# Patient Record
Sex: Female | Born: 1940 | Race: White | Hispanic: No | Marital: Married | Smoking: Never smoker
Health system: Southern US, Community
[De-identification: ages and names within clinical notes are randomized; demographics above are authoritative.]

## PROBLEM LIST (undated history)

## (undated) DIAGNOSIS — M810 Age-related osteoporosis without current pathological fracture: Secondary | ICD-10-CM

## (undated) DIAGNOSIS — N183 Chronic kidney disease, stage 3 unspecified: Secondary | ICD-10-CM

## (undated) DIAGNOSIS — Z974 Presence of external hearing-aid: Secondary | ICD-10-CM

## (undated) DIAGNOSIS — C541 Malignant neoplasm of endometrium: Secondary | ICD-10-CM

## (undated) DIAGNOSIS — Z973 Presence of spectacles and contact lenses: Secondary | ICD-10-CM

## (undated) DIAGNOSIS — M1990 Unspecified osteoarthritis, unspecified site: Secondary | ICD-10-CM

## (undated) DIAGNOSIS — N840 Polyp of corpus uteri: Secondary | ICD-10-CM

## (undated) DIAGNOSIS — Z8669 Personal history of other diseases of the nervous system and sense organs: Secondary | ICD-10-CM

## (undated) HISTORY — PX: OTHER SURGICAL HISTORY: SHX169

## (undated) HISTORY — PX: TONSILLECTOMY: SUR1361

## (undated) HISTORY — PX: FEMUR FRACTURE SURGERY: SHX633

## (undated) HISTORY — PX: BREAST EXCISIONAL BIOPSY: SUR124

## (undated) HISTORY — DX: Chronic kidney disease, stage 3 unspecified: N18.30

## (undated) HISTORY — DX: Malignant neoplasm of endometrium: C54.1

## (undated) HISTORY — DX: Age-related osteoporosis without current pathological fracture: M81.0

## (undated) HISTORY — PX: CATARACT EXTRACTION: SUR2

---

## 1968-01-06 DIAGNOSIS — S060XAA Concussion with loss of consciousness status unknown, initial encounter: Secondary | ICD-10-CM

## 1968-01-06 DIAGNOSIS — S060X9A Concussion with loss of consciousness of unspecified duration, initial encounter: Secondary | ICD-10-CM

## 1968-01-06 HISTORY — DX: Concussion with loss of consciousness status unknown, initial encounter: S06.0XAA

## 1968-01-06 HISTORY — DX: Concussion with loss of consciousness of unspecified duration, initial encounter: S06.0X9A

## 1999-12-03 ENCOUNTER — Encounter: Payer: Self-pay | Admitting: General Surgery

## 1999-12-03 ENCOUNTER — Encounter: Admission: RE | Admit: 1999-12-03 | Discharge: 1999-12-03 | Payer: Self-pay | Admitting: General Surgery

## 2001-01-24 ENCOUNTER — Encounter: Admission: RE | Admit: 2001-01-24 | Discharge: 2001-01-24 | Payer: Self-pay | Admitting: General Surgery

## 2001-01-24 ENCOUNTER — Encounter: Payer: Self-pay | Admitting: General Surgery

## 2002-06-07 ENCOUNTER — Encounter: Admission: RE | Admit: 2002-06-07 | Discharge: 2002-06-07 | Payer: Self-pay | Admitting: General Surgery

## 2002-06-07 ENCOUNTER — Encounter: Payer: Self-pay | Admitting: General Surgery

## 2003-05-17 DIAGNOSIS — J309 Allergic rhinitis, unspecified: Secondary | ICD-10-CM | POA: Insufficient documentation

## 2003-08-10 ENCOUNTER — Encounter: Admission: RE | Admit: 2003-08-10 | Discharge: 2003-08-10 | Payer: Self-pay | Admitting: General Surgery

## 2004-08-13 ENCOUNTER — Encounter: Admission: RE | Admit: 2004-08-13 | Discharge: 2004-08-13 | Payer: Self-pay | Admitting: General Surgery

## 2005-08-17 ENCOUNTER — Encounter: Admission: RE | Admit: 2005-08-17 | Discharge: 2005-08-17 | Payer: Self-pay | Admitting: General Surgery

## 2006-10-26 ENCOUNTER — Encounter: Admission: RE | Admit: 2006-10-26 | Discharge: 2006-10-26 | Payer: Self-pay | Admitting: Obstetrics and Gynecology

## 2007-11-08 ENCOUNTER — Encounter: Admission: RE | Admit: 2007-11-08 | Discharge: 2007-11-08 | Payer: Self-pay | Admitting: Obstetrics and Gynecology

## 2009-09-27 ENCOUNTER — Encounter: Admission: RE | Admit: 2009-09-27 | Discharge: 2009-09-27 | Payer: Self-pay | Admitting: Obstetrics and Gynecology

## 2010-09-29 ENCOUNTER — Other Ambulatory Visit: Payer: Self-pay

## 2010-09-29 DIAGNOSIS — Z1231 Encounter for screening mammogram for malignant neoplasm of breast: Secondary | ICD-10-CM

## 2010-10-29 ENCOUNTER — Ambulatory Visit
Admission: RE | Admit: 2010-10-29 | Discharge: 2010-10-29 | Disposition: A | Discharge status: Home / Self Care | Payer: Medicare Other | Source: Ambulatory Visit

## 2010-10-29 DIAGNOSIS — Z1231 Encounter for screening mammogram for malignant neoplasm of breast: Secondary | ICD-10-CM

## 2010-11-12 DIAGNOSIS — M533 Sacrococcygeal disorders, not elsewhere classified: Secondary | ICD-10-CM | POA: Insufficient documentation

## 2011-03-24 ENCOUNTER — Encounter: Payer: Self-pay | Admitting: Gynecology

## 2011-03-24 ENCOUNTER — Ambulatory Visit (INDEPENDENT_AMBULATORY_CARE_PROVIDER_SITE_OTHER): Payer: Medicare Other | Admitting: Gynecology

## 2011-03-24 VITALS — BP 120/80 | Ht 63.0 in | Wt 145.0 lb

## 2011-03-24 DIAGNOSIS — M949 Disorder of cartilage, unspecified: Secondary | ICD-10-CM

## 2011-03-24 DIAGNOSIS — M899 Disorder of bone, unspecified: Secondary | ICD-10-CM

## 2011-03-24 DIAGNOSIS — Z79890 Hormone replacement therapy (postmenopausal): Secondary | ICD-10-CM

## 2011-03-24 DIAGNOSIS — N952 Postmenopausal atrophic vaginitis: Secondary | ICD-10-CM

## 2011-03-24 DIAGNOSIS — M8580 Other specified disorders of bone density and structure, unspecified site: Secondary | ICD-10-CM | POA: Insufficient documentation

## 2011-03-24 NOTE — Patient Instructions (Signed)
Follow up in one year for annual follow up.

## 2011-03-24 NOTE — Progress Notes (Signed)
Pam Smith [birth year] [id number]        71 y.o.  New patient, former patient of Dr. August Saucer [facility]. Has been using intermittent vaginal estrogen cream for atrophic genital changes.   Past medical history,surgical history, medications, allergies, family history and social history were all reviewed and documented in the EPIC chart. ROS:  Was performed and pertinent positives and negatives are included in the history.  Exam: Sherrilyn Rist chaperone present Filed Vitals:   03/24/11 0956  BP: 120/80   General appearance  Normal Skin grossly normal Head/Neck normal with no cervical or supraclavicular adenopathy thyroid normal Lungs  clear Cardiac RR, without RMG Abdominal  soft, nontender, without masses, organomegaly or hernia Breasts  examined lying and sitting without masses, retractions, discharge or axillary adenopathy. Pelvic  Ext/BUS/vagina  normal with atrophic genital changes  Cervix  normal    Uterus  anteverted, normal size, shape and contour, midline and mobile nontender   Adnexa  Without masses or tenderness    Anus and perineum  normal   Rectovaginal  normal sphincter tone without palpated masses or tenderness.    Assessment/Plan:  71 y.o. female for annual exam.    1. HRT. Patient had used Prempro in the past although has switched over to an intermittent vaginal Premarin cream for atrophic changes 3 times weekly. Patient admits to not using it consistently and wondered whether she needs to continue this. She's not having vaginitis symptoms and is not sexually active. She's had no bleeding or other symptoms. I discussed the WHI study with her in the issues of estrogen supplementation and the risks of stroke, heart attack, DVT, possible breast cancer issues. I reviewed the absorption issue with vaginal cream and alternatives to include Vagifem with less absorption. As she is asymptomatic and not sexually active I suggested she stop her estrogen cream altogether noting that she is  using it only occasionally now and we'll see how she does. The issue as to whether we should do any endometrial surveillance at this time such as ultrasound or endometrial biopsy was reviewed and given again that she has only used it sparingly and has no other symptoms such as bleeding we both agree on observation at present which I think is reasonable. 2. Pap smear. Patient has a history of ASCUS in 1997 with a negative colposcopy. All of her follow up Pap smears have been negative. I discussed current screening guidelines. As she is over the age of 62 and has no history of significant abnormal Pap smears options for stopping screening altogether or less frequent intervals reviewed at this point we will plan on stop screening. Her last Pap smear was February 2012 it was normal with atrophic changes. 3. Mammography. Patient is due for her mammogram now and knows to schedule this. SBE monthly reviewed. 4. Colonoscopy. Patient is within 5 years of her colonoscopy which she does receive every 5 years. I did give her an OC light kit to check her stool for blood and she knows to mail back, call us to make sure received it and that is negative. 5. Osteopenia. 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 She does not remember having a vitamin D level taken and she does not take extra vitamin D.  I ordered one today. She will otherwise follow up with her primary physician in reference to her bone health/medication use. 6. Health maintenance. No other blood work was done today  as it is all done through her primary physician's office. Assuming she continues well from a gynecologic standpoint and she will see me in a year, sooner as needed.   Dara Lords MD, 11:42 AM 03/24/2011

## 2011-03-25 LAB — VITAMIN D 25 HYDROXY (VIT D DEFICIENCY, FRACTURES): Vit D, 25-Hydroxy: 60 ng/mL (ref 30–89)

## 2011-04-28 DIAGNOSIS — Z1211 Encounter for screening for malignant neoplasm of colon: Secondary | ICD-10-CM

## 2011-04-29 ENCOUNTER — Other Ambulatory Visit: Payer: Self-pay | Admitting: *Deleted

## 2011-04-29 ENCOUNTER — Other Ambulatory Visit: Payer: Self-pay | Admitting: Gynecology

## 2011-04-29 DIAGNOSIS — Z1211 Encounter for screening for malignant neoplasm of colon: Secondary | ICD-10-CM

## 2011-10-20 ENCOUNTER — Other Ambulatory Visit: Payer: Self-pay | Admitting: Gynecology

## 2011-10-20 ENCOUNTER — Other Ambulatory Visit: Payer: Self-pay

## 2011-10-20 DIAGNOSIS — Z1231 Encounter for screening mammogram for malignant neoplasm of breast: Secondary | ICD-10-CM

## 2011-11-18 ENCOUNTER — Ambulatory Visit
Admission: RE | Admit: 2011-11-18 | Discharge: 2011-11-18 | Disposition: A | Discharge status: Home / Self Care | Payer: Medicare Other | Source: Ambulatory Visit | Attending: Gynecology | Admitting: Gynecology

## 2011-11-18 DIAGNOSIS — Z1231 Encounter for screening mammogram for malignant neoplasm of breast: Secondary | ICD-10-CM

## 2011-12-09 DIAGNOSIS — D171 Benign lipomatous neoplasm of skin and subcutaneous tissue of trunk: Secondary | ICD-10-CM | POA: Insufficient documentation

## 2012-03-29 ENCOUNTER — Encounter: Payer: Self-pay | Admitting: Gynecology

## 2012-03-29 ENCOUNTER — Ambulatory Visit (INDEPENDENT_AMBULATORY_CARE_PROVIDER_SITE_OTHER): Payer: Medicare Other | Admitting: Gynecology

## 2012-03-29 VITALS — BP 120/74 | Ht 62.0 in | Wt 146.0 lb

## 2012-03-29 DIAGNOSIS — N951 Menopausal and female climacteric states: Secondary | ICD-10-CM

## 2012-03-29 DIAGNOSIS — M8580 Other specified disorders of bone density and structure, unspecified site: Secondary | ICD-10-CM

## 2012-03-29 DIAGNOSIS — M899 Disorder of bone, unspecified: Secondary | ICD-10-CM

## 2012-03-29 DIAGNOSIS — N952 Postmenopausal atrophic vaginitis: Secondary | ICD-10-CM

## 2012-03-29 NOTE — Patient Instructions (Signed)
Follow up in one year for annual exam 

## 2012-03-29 NOTE — Progress Notes (Signed)
Pam Smith 05-19-1940 [id number]        72 y.o.  G1P1001 for followup exam.    Past medical history,surgical history, medications, allergies, family history and social history were all reviewed and documented in the EPIC chart. ROS:  Was performed and pertinent positives and negatives are included in the history.  Exam: [name] assistant Filed Vitals:   03/29/12 0943  BP: 120/74  Height: 5\' 2"  (1.575 m)  Weight: 146 lb (66.225 kg)   General appearance  Normal Skin grossly normal Head/Neck normal with no cervical or supraclavicular adenopathy thyroid normal Lungs  clear Cardiac RR, without RMG Abdominal  soft, nontender, without masses, organomegaly or hernia Breasts  examined lying and sitting without masses, retractions, discharge or axillary adenopathy. Pelvic  Ext/BUS/vagina  normal with atrophic changes  Cervix  normal with atrophic changes  Uterus  anteverted, normal size, shape and contour, midline and mobile nontender   Adnexa  Without masses or tenderness    Anus and perineum  normal   Rectovaginal  normal sphincter tone without palpated masses or tenderness.    Assessment/Plan:  72 y.o. G1P1001 female for followup exam.   1. Atrophic vaginitis.  Had been on Premarin vaginal cream last year and discontinued this. Has done well without symptoms and will continue to stay off of estrogen. 2. Menopausal symptoms. Patient having occasional hot flashes. I reviewed again with her the issues of HRT and whether it's worth risk versus benefits. Patient's not interested and would prefer just to monitor her symptoms. 3. Osteopenia. Being followed by her primary for low bone mass. On alendronate. Do not have copies of her DEXA. Patient will continue to follow up with her primary physician in reference to this. Increase calcium vitamin D reviewed. 4. Pap smear 2012. No Pap smear done today. History of ASCUS 1997 with negative colposcopy and normal Pap smears since then. Options to stop  screening altogether she is over the age of 70 versus less frequent screening intervals again reviewed with her and we'll readdress on an annual basis. 5. Colonoscopy 4 years ago. Due to repeat next year will followup for this. 6. Health maintenance. No lab work done as it is all done through her primary physician's office who she sees on record basis. Followup one year, sooner as needed.      Dara Lords MD, 10:26 AM 03/29/2012

## 2013-01-09 ENCOUNTER — Other Ambulatory Visit: Payer: Self-pay

## 2013-01-09 DIAGNOSIS — Z1231 Encounter for screening mammogram for malignant neoplasm of breast: Secondary | ICD-10-CM

## 2013-02-01 ENCOUNTER — Ambulatory Visit
Admission: RE | Admit: 2013-02-01 | Discharge: 2013-02-01 | Disposition: A | Discharge status: Home / Self Care | Payer: Medicare Other | Source: Ambulatory Visit

## 2013-02-01 DIAGNOSIS — Z1231 Encounter for screening mammogram for malignant neoplasm of breast: Secondary | ICD-10-CM

## 2013-03-31 ENCOUNTER — Encounter: Payer: Medicare Other | Admitting: Gynecology

## 2013-04-24 ENCOUNTER — Other Ambulatory Visit (HOSPITAL_COMMUNITY)
Admission: RE | Admit: 2013-04-24 | Discharge: 2013-04-24 | Disposition: A | Discharge status: Home / Self Care | Payer: Medicare Other | Source: Ambulatory Visit | Attending: Gynecology | Admitting: Gynecology

## 2013-04-24 ENCOUNTER — Encounter: Payer: Self-pay | Admitting: Gynecology

## 2013-04-24 ENCOUNTER — Ambulatory Visit (INDEPENDENT_AMBULATORY_CARE_PROVIDER_SITE_OTHER): Payer: Medicare Other | Admitting: Gynecology

## 2013-04-24 VITALS — BP 120/78 | Ht 62.0 in | Wt 152.0 lb

## 2013-04-24 DIAGNOSIS — M8580 Other specified disorders of bone density and structure, unspecified site: Secondary | ICD-10-CM

## 2013-04-24 DIAGNOSIS — Z124 Encounter for screening for malignant neoplasm of cervix: Secondary | ICD-10-CM | POA: Insufficient documentation

## 2013-04-24 DIAGNOSIS — M949 Disorder of cartilage, unspecified: Secondary | ICD-10-CM

## 2013-04-24 DIAGNOSIS — M899 Disorder of bone, unspecified: Secondary | ICD-10-CM

## 2013-04-24 DIAGNOSIS — N952 Postmenopausal atrophic vaginitis: Secondary | ICD-10-CM

## 2013-04-24 NOTE — Progress Notes (Signed)
Pam Smith 26-Mar-1940 [id number]        72 y.o.  G1P1001 for followup exam.  Several issues noted below.  Past medical history,surgical history, problem list, medications, allergies, family history and social history were all reviewed and documented as reviewed in the EPIC chart.  ROS:  12 system ROS performed with pertinent positives and negatives included in the history, assessment and plan.  Included Systems: General, HEENT, Neck, Cardiovascular, Pulmonary, Gastrointestinal, Genitourinary, Musculoskeletal, Dermatologic, Endocrine, Hematological, Neurologic, Psychiatric Additional significant findings : Urinary urgency felt secondary to ibuprofens now resolving since she stopped   Exam: Pam Smith assistant Filed Vitals:   04/24/13 1205  BP: 120/78  Height: 5\' 2"  (1.575 m)  Weight: 152 lb (68.947 kg)   General appearance:  Normal affect, orientation and appearance. Skin: Grossly normal HEENT: Normal without gross oral lesions, cervical or supraclavicular adenopathy. Thyroid normal.  Lungs:  Clear without wheezing, rales or rhonchi Cardiac: RR, without RMG Abdominal:  Soft, nontender, without masses, guarding, rebound, organomegaly or hernia Breasts:  Examined lying and sitting without masses, retractions, discharge or axillary adenopathy. Pelvic:  Ext/BUS/vagina with generalized atrophic changes  Cervix atrophic. Pap done  Uterus axial to anteverted, normal size, shape and contour, midline and mobile nontender   Adnexa  Without masses or tenderness    Anus and perineum  Normal   Rectovaginal  Normal sphincter tone without palpated masses or tenderness.    Assessment/Plan:  73 y.o. G60P1001 female for followup exam.  1. Atrophic vaginitis/postmenopausal. Using Premarin vaginal cream through her primary physician's order 3 times weekly. Doing well with this for vaginal dryness and wants to continue. He is providing the refills. We reviewed the risks of absorption and potential side  effects to include thrombosis such as stroke heart attack DVT, breast cancer, endometrial stimulation. Has been no vaginal bleeding. Patient wants to continue and will get the prescription through her primary. Patient knows to report any vaginal bleeding. 2. Pap smear 2012. Pap smear done today. History of ASCUS 1997 with negative colposcopy. Her followup Pap smears have all been negative. Reviewed current screening guidelines and osseous to stop screening she 81 age 81 discussed. Patient feels great uncomfortable with this and requested Pap smear today. 3. Osteopenia. Being followed by her primary for this on alendronate. We'll continue to follow up with him in reference to this. 4. Mammography 01/2013. Will continue with annual mammography. SBE monthly reviewed. 5. Colonoscopy 2010. Repeat at their recommended interval. 6. Health maintenance. No blood work done today as it's all done through her primary physician's office. Check urinalysis. Followup one year, sooner as needed.   Note: This document was prepared with digital dictation and possible smart phrase technology. Any transcriptional errors that result from this process are unintentional.   Pam Auerbach MD, 12:49 PM 04/24/2013

## 2013-04-24 NOTE — Patient Instructions (Addendum)
Followup in one year, sooner as needed.  Report any vaginal bleeding.

## 2013-04-25 LAB — URINALYSIS W MICROSCOPIC + REFLEX CULTURE
BILIRUBIN URINE: NEGATIVE
Bacteria, UA: NONE SEEN
CASTS: NONE SEEN
GLUCOSE, UA: NEGATIVE mg/dL
HGB URINE DIPSTICK: NEGATIVE
Ketones, ur: NEGATIVE mg/dL
LEUKOCYTES UA: NEGATIVE
Nitrite: NEGATIVE
PH: 5.5 (ref 5.0–8.0)
Protein, ur: NEGATIVE mg/dL
SQUAMOUS EPITHELIAL / LPF: NONE SEEN
Specific Gravity, Urine: 1.019 (ref 1.005–1.030)
Urobilinogen, UA: 0.2 mg/dL (ref 0.0–1.0)

## 2013-11-06 ENCOUNTER — Encounter: Payer: Self-pay | Admitting: Gynecology

## 2014-02-12 ENCOUNTER — Other Ambulatory Visit: Payer: Self-pay

## 2014-02-12 DIAGNOSIS — Z1231 Encounter for screening mammogram for malignant neoplasm of breast: Secondary | ICD-10-CM

## 2014-02-27 ENCOUNTER — Ambulatory Visit: Payer: Medicare Other

## 2014-03-02 ENCOUNTER — Ambulatory Visit
Admission: RE | Admit: 2014-03-02 | Discharge: 2014-03-02 | Disposition: A | Discharge status: Home / Self Care | Payer: Medicare Other | Source: Ambulatory Visit

## 2014-03-02 DIAGNOSIS — Z1231 Encounter for screening mammogram for malignant neoplasm of breast: Secondary | ICD-10-CM

## 2014-04-30 ENCOUNTER — Ambulatory Visit (INDEPENDENT_AMBULATORY_CARE_PROVIDER_SITE_OTHER): Payer: Medicare Other | Admitting: Gynecology

## 2014-04-30 ENCOUNTER — Encounter: Payer: Self-pay | Admitting: Gynecology

## 2014-04-30 VITALS — BP 126/78 | Ht 63.0 in | Wt 158.0 lb

## 2014-04-30 DIAGNOSIS — N952 Postmenopausal atrophic vaginitis: Secondary | ICD-10-CM

## 2014-04-30 DIAGNOSIS — Z01419 Encounter for gynecological examination (general) (routine) without abnormal findings: Secondary | ICD-10-CM

## 2014-04-30 DIAGNOSIS — M8580 Other specified disorders of bone density and structure, unspecified site: Secondary | ICD-10-CM

## 2014-04-30 NOTE — Progress Notes (Signed)
Pam Smith Dec 06, 1940 [id number]        74 y.o.  G1P1001 for breast and pelvic exam.  Past medical history,surgical history, problem list, medications, allergies, family history and social history were all reviewed and documented as reviewed in the EPIC chart.  ROS:  Performed with pertinent positives and negatives included in the history, assessment and plan.   Additional significant findings :  none   Exam: [name] Counsellor Vitals:   04/30/14 1448  BP: 126/78  Height: 5\' 3"  (1.6 m)  Weight: 158 lb (71.668 kg)   General appearance:  Normal affect, orientation and appearance. Skin: Grossly normal HEENT: Without gross lesions.  No cervical or supraclavicular adenopathy. Thyroid normal.  Lungs:  Clear without wheezing, rales or rhonchi Cardiac: RR, without RMG Abdominal:  Soft, nontender, without masses, guarding, rebound, organomegaly or hernia Breasts:  Examined lying and sitting without masses, retractions, discharge or axillary adenopathy. Pelvic:  Ext/BUS/vagina with generalized atrophic changes  Cervix with atrophic changes  Uterus anteverted, normal size, shape and contour, midline and mobile nontender   Adnexa  Without masses or tenderness    Anus and perineum  Normal   Rectovaginal  Normal sphincter tone without palpated masses or tenderness.    Assessment/Plan:  74 y.o. G21P1001 female for breast and pelvic exam.   1. Postmenopausal/atrophic genital changes. Patient continues to use Premarin vaginal cream 2 times to 3 times weekly per her primary physician. Feels that really helps her with her vaginal dryness. No vaginal bleeding. We have discussed the risks of absorption and side effects to include thrombosis, breast cancer and uterine stimulation. Patient's comfortable continuing. 2. Osteopenia. Followed by her primary physician. I have no copies of her bone densities. Reports her most recent density 01/2014 improved. Also reports being on Fosamax now for  approximately 5 years.  I reviewed with her the issues of drug-free holiday and to discuss this with her primary. She'll continue follow up with him in reference to this. 3. Pap smear 2015. No Pap smear done today. No history of significant abnormal Pap smears. Options to stop screening altogether she is over the age of 32 versus less frequent screening intervals reviewed. Will readdress on an annual basis. 4. Mammography 02/2014. Continue with annual mammography. SBE monthly reviewed. 5. Colonoscopy 2010. Repeat at their recommended interval. 6. Health maintenance. No routine lab work done as this is done at her primary physician's office. Follow up in one year, sooner as needed.     [name] MD, 3:20 PM 04/30/2014

## 2014-04-30 NOTE — Patient Instructions (Signed)
You may obtain a copy of any labs that were done today by logging onto MyChart as outlined in the instructions provided with your AVS (after visit summary). The office will not call with normal lab results but certainly if there are any significant abnormalities then we will contact you.   Health Maintenance, Female A healthy lifestyle and preventative care can promote health and wellness.  Maintain regular health, dental, and eye exams.  Eat a healthy diet. Foods like vegetables, fruits, whole grains, low-fat dairy products, and lean protein foods contain the nutrients you need without too many calories. Decrease your intake of foods high in solid fats, added sugars, and salt. Get information about a proper diet from your caregiver, if necessary.  Regular physical exercise is one of the most important things you can do for your health. Most adults should get at least 150 minutes of moderate-intensity exercise (any activity that increases your heart rate and causes you to sweat) each week. In addition, most adults need muscle-strengthening exercises on 2 or more days a week.   Maintain a healthy weight. The body mass index (BMI) is a screening tool to identify possible weight problems. It provides an estimate of body fat based on height and weight. Your caregiver can help determine your BMI, and can help you achieve or maintain a healthy weight. For adults 20 years and older:  A BMI below 18.5 is considered underweight.  A BMI of 18.5 to 24.9 is normal.  A BMI of 25 to 29.9 is considered overweight.  A BMI of 30 and above is considered obese.  Maintain normal blood lipids and cholesterol by exercising and minimizing your intake of saturated fat. Eat a balanced diet with plenty of fruits and vegetables. Blood tests for lipids and cholesterol should begin at age 61 and be repeated every 5 years. If your lipid or cholesterol levels are high, you are over 50, or you are a high risk for heart  disease, you may need your cholesterol levels checked more frequently.Ongoing high lipid and cholesterol levels should be treated with medicines if diet and exercise are not effective.  If you smoke, find out from your caregiver how to quit. If you do not use tobacco, do not start.  Lung cancer screening is recommended for adults aged 33 80 years who are at high risk for developing lung cancer because of a history of smoking. Yearly low-dose computed tomography (CT) is recommended for people who have at least a 30-pack-year history of smoking and are a current smoker or have quit within the past 15 years. A pack year of smoking is smoking an average of 1 pack of cigarettes a day for 1 year (for example: 1 pack a day for 30 years or 2 packs a day for 15 years). Yearly screening should continue until the smoker has stopped smoking for at least 15 years. Yearly screening should also be stopped for people who develop a health problem that would prevent them from having lung cancer treatment.  If you are pregnant, do not drink alcohol. If you are breastfeeding, be very cautious about drinking alcohol. If you are not pregnant and choose to drink alcohol, do not exceed 1 drink per day. One drink is considered to be 12 ounces (355 mL) of beer, 5 ounces (148 mL) of wine, or 1.5 ounces (44 mL) of liquor.  Avoid use of street drugs. Do not share needles with anyone. Ask for help if you need support or instructions about stopping  the use of drugs.  High blood pressure causes heart disease and increases the risk of stroke. Blood pressure should be checked at least every 1 to 2 years. Ongoing high blood pressure should be treated with medicines, if weight loss and exercise are not effective.  If you are 59 to 72 years old, ask your caregiver if you should take aspirin to prevent strokes.  Diabetes screening involves taking a blood sample to check your fasting blood sugar level. This should be done once every 3  years, after age 91, if you are within normal weight and without risk factors for diabetes. Testing should be considered at a younger age or be carried out more frequently if you are overweight and have at least 1 risk factor for diabetes.  Breast cancer screening is essential preventative care for women. You should practice "breast self-awareness." This means understanding the normal appearance and feel of your breasts and may include breast self-examination. Any changes detected, no matter how small, should be reported to a caregiver. Women in their 66s and 30s should have a clinical breast exam (CBE) by a caregiver as part of a regular health exam every 1 to 3 years. After age 101, women should have a CBE every year. Starting at age 100, women should consider having a mammogram (breast X-ray) every year. Women who have a family history of breast cancer should talk to their caregiver about genetic screening. Women at a high risk of breast cancer should talk to their caregiver about having an MRI and a mammogram every year.  Breast cancer gene (BRCA)-related cancer risk assessment is recommended for women who have family members with BRCA-related cancers. BRCA-related cancers include breast, ovarian, tubal, and peritoneal cancers. Having family members with these cancers may be associated with an increased risk for harmful changes (mutations) in the breast cancer genes BRCA1 and BRCA2. Results of the assessment will determine the need for genetic counseling and BRCA1 and BRCA2 testing.  The Pap test is a screening test for cervical cancer. Women should have a Pap test starting at age 57. Between ages 25 and 35, Pap tests should be repeated every 2 years. Beginning at age 37, you should have a Pap test every 3 years as long as the past 3 Pap tests have been normal. If you had a hysterectomy for a problem that was not cancer or a condition that could lead to cancer, then you no longer need Pap tests. If you are  between ages 50 and 76, and you have had normal Pap tests going back 10 years, you no longer need Pap tests. If you have had past treatment for cervical cancer or a condition that could lead to cancer, you need Pap tests and screening for cancer for at least 20 years after your treatment. If Pap tests have been discontinued, risk factors (such as a new sexual partner) need to be reassessed to determine if screening should be resumed. Some women have medical problems that increase the chance of getting cervical cancer. In these cases, your caregiver may recommend more frequent screening and Pap tests.  The human papillomavirus (HPV) test is an additional test that may be used for cervical cancer screening. The HPV test looks for the virus that can cause the cell changes on the cervix. The cells collected during the Pap test can be tested for HPV. The HPV test could be used to screen women aged 44 years and older, and should be used in women of any age  who have unclear Pap test results. After the age of 55, women should have HPV testing at the same frequency as a Pap test.  Colorectal cancer can be detected and often prevented. Most routine colorectal cancer screening begins at the age of 44 and continues through age 20. However, your caregiver may recommend screening at an earlier age if you have risk factors for colon cancer. On a yearly basis, your caregiver may provide home test kits to check for hidden blood in the stool. Use of a small camera at the end of a tube, to directly examine the colon (sigmoidoscopy or colonoscopy), can detect the earliest forms of colorectal cancer. Talk to your caregiver about this at age 86, when routine screening begins. Direct examination of the colon should be repeated every 5 to 10 years through age 13, unless early forms of pre-cancerous polyps or small growths are found.  Hepatitis C blood testing is recommended for all people born from [birth year] through 1963 and any  individual with known risks for hepatitis C.  Practice safe sex. Use condoms and avoid high-risk sexual practices to reduce the spread of sexually transmitted infections (STIs). Sexually active women aged 36 and younger should be checked for Chlamydia, which is a common sexually transmitted infection. Older women with new or multiple partners should also be tested for Chlamydia. Testing for other STIs is recommended if you are sexually active and at increased risk.  Osteoporosis is a disease in which the bones lose minerals and strength with aging. This can result in serious bone fractures. The risk of osteoporosis can be identified using a bone density scan. Women ages 20 and over and women at risk for fractures or osteoporosis should discuss screening with their caregivers. Ask your caregiver whether you should be taking a calcium supplement or vitamin D to reduce the rate of osteoporosis.  Menopause can be associated with physical symptoms and risks. Hormone replacement therapy is available to decrease symptoms and risks. You should talk to your caregiver about whether hormone replacement therapy is right for you.  Use sunscreen. Apply sunscreen liberally and repeatedly throughout the day. You should seek shade when your shadow is shorter than you. Protect yourself by wearing long sleeves, pants, a wide-brimmed hat, and sunglasses year round, whenever you are outdoors.  Notify your caregiver of new moles or changes in moles, especially if there is a change in shape or color. Also notify your caregiver if a mole is larger than the size of a pencil eraser.  Stay current with your immunizations. Document Released: 07/07/2010 Document Revised: 04/18/2012 Document Reviewed: 07/07/2010 [facility] Patient Information 2014 [employer].

## 2014-06-06 ENCOUNTER — Encounter: Payer: Medicare Other | Admitting: Gynecology

## 2014-10-18 DIAGNOSIS — E782 Mixed hyperlipidemia: Secondary | ICD-10-CM | POA: Insufficient documentation

## 2015-02-22 DIAGNOSIS — M810 Age-related osteoporosis without current pathological fracture: Secondary | ICD-10-CM | POA: Insufficient documentation

## 2015-02-22 DIAGNOSIS — K449 Diaphragmatic hernia without obstruction or gangrene: Secondary | ICD-10-CM | POA: Insufficient documentation

## 2015-03-08 ENCOUNTER — Other Ambulatory Visit: Payer: Self-pay

## 2015-03-08 DIAGNOSIS — Z1231 Encounter for screening mammogram for malignant neoplasm of breast: Secondary | ICD-10-CM

## 2015-03-27 ENCOUNTER — Ambulatory Visit
Admission: RE | Admit: 2015-03-27 | Discharge: 2015-03-27 | Disposition: A | Discharge status: Home / Self Care | Payer: Medicare Other | Source: Ambulatory Visit

## 2015-03-27 DIAGNOSIS — Z1231 Encounter for screening mammogram for malignant neoplasm of breast: Secondary | ICD-10-CM

## 2015-03-28 ENCOUNTER — Other Ambulatory Visit: Payer: Self-pay | Admitting: Gynecology

## 2015-03-28 DIAGNOSIS — R928 Other abnormal and inconclusive findings on diagnostic imaging of breast: Secondary | ICD-10-CM

## 2015-04-04 ENCOUNTER — Ambulatory Visit
Admission: RE | Admit: 2015-04-04 | Discharge: 2015-04-04 | Disposition: A | Discharge status: Home / Self Care | Payer: Medicare Other | Source: Ambulatory Visit | Attending: Gynecology | Admitting: Gynecology

## 2015-04-04 DIAGNOSIS — R928 Other abnormal and inconclusive findings on diagnostic imaging of breast: Secondary | ICD-10-CM

## 2015-05-06 ENCOUNTER — Encounter: Payer: Medicare Other | Admitting: Gynecology

## 2015-05-10 ENCOUNTER — Encounter: Payer: Self-pay | Admitting: Gynecology

## 2015-05-10 ENCOUNTER — Ambulatory Visit (INDEPENDENT_AMBULATORY_CARE_PROVIDER_SITE_OTHER): Payer: Medicare Other | Admitting: Gynecology

## 2015-05-10 VITALS — BP 124/74 | Ht 63.0 in | Wt 155.0 lb

## 2015-05-10 DIAGNOSIS — Z01419 Encounter for gynecological examination (general) (routine) without abnormal findings: Secondary | ICD-10-CM | POA: Diagnosis not present

## 2015-05-10 DIAGNOSIS — M8580 Other specified disorders of bone density and structure, unspecified site: Secondary | ICD-10-CM

## 2015-05-10 DIAGNOSIS — N952 Postmenopausal atrophic vaginitis: Secondary | ICD-10-CM

## 2015-05-10 MED ORDER — ESTROGENS, CONJUGATED 0.625 MG/GM VA CREA: 1.0000 g | TOPICAL_CREAM | VAGINAL | Status: DC

## 2015-05-10 NOTE — Patient Instructions (Signed)

## 2015-05-10 NOTE — Progress Notes (Signed)
    Pam Smith 08/18/1940 [id number]        75 y.o.  G1P1001  for breast and pelvic exam.  Past medical history,surgical history, problem list, medications, allergies, family history and social history were all reviewed and documented as reviewed in the EPIC chart.  ROS:  Performed with pertinent positives and negatives included in the history, assessment and plan.   Additional significant findings :  none   Exam: [name] assistant Filed Vitals:   05/10/15 1357  BP: 124/74  Height: 5\' 3"  (1.6 m)  Weight: 155 lb (70.308 kg)   General appearance:  Normal affect, orientation and appearance. Skin: Grossly normal HEENT: Without gross lesions.  No cervical or supraclavicular adenopathy. Thyroid normal.  Lungs:  Clear without wheezing, rales or rhonchi Cardiac: RR, without RMG Abdominal:  Soft, nontender, without masses, guarding, rebound, organomegaly or hernia Breasts:  Examined lying and sitting without masses, retractions, discharge or axillary adenopathy. Pelvic:  Ext/BUS/vagina with atrophic changes  Cervix flush with the upper vagina  Uterus anteverted, normal size, shape and contour, midline and mobile nontender   Adnexa without masses or tenderness    Anus and perineum normal   Rectovaginal normal sphincter tone without palpated masses or tenderness.    Assessment/Plan:  75 y.o. G86P1001 female for breast and pelvic exam.   1. Postmenopausal/atrophic genital changes. Patient using Premarin cream but not consistently. Notes an increase in UTIs that her primary physician is seeing her for. Reviewed options to include using the Premarin cream more consistently to see if this doesn't help with not only vaginal support but possible bladder support. I again reviewed risks of absorption and side effects to include thrombosis such as stroke, heart attack, DVT, possible breast cancer and endometrial stimulation. Patient's comfortable continuing and will use it twice weekly.  Refill 1 year provided. 2. Osteopenia. Actively being followed by her primary physician on Fosamax. They're discussing about when to start a drug-free holiday and she'll continue to follow up with him in reference to this. Reports being on this for approximately 4-5 years. 3. Pap smear 2015. No Pap smear done today. No history of significant abnormal Pap smears. Options to stop screening versus less frequent screening intervals reviewed. Will readdress on anal basis. 4. Mammography 03/2015. Continue with annual mammography when due. SBE monthly reviewed. 5. Colonoscopy 2010. Repeat at their recommended interval. 6. Health maintenance. No routine lab work done as this is done at her primary physician's office. Follow up 1 year, sooner as needed.   [name] MD, 2:27 PM 05/10/2015

## 2015-07-09 DIAGNOSIS — D126 Benign neoplasm of colon, unspecified: Secondary | ICD-10-CM | POA: Insufficient documentation

## 2016-04-06 ENCOUNTER — Other Ambulatory Visit: Payer: Self-pay | Admitting: Gynecology

## 2016-04-06 DIAGNOSIS — Z1231 Encounter for screening mammogram for malignant neoplasm of breast: Secondary | ICD-10-CM

## 2016-04-22 ENCOUNTER — Other Ambulatory Visit: Payer: Self-pay | Admitting: Gynecology

## 2016-04-22 ENCOUNTER — Ambulatory Visit
Admission: RE | Admit: 2016-04-22 | Discharge: 2016-04-22 | Disposition: A | Discharge status: Home / Self Care | Payer: Medicare Other | Source: Ambulatory Visit | Attending: Gynecology | Admitting: Gynecology

## 2016-04-22 DIAGNOSIS — Z1231 Encounter for screening mammogram for malignant neoplasm of breast: Secondary | ICD-10-CM

## 2016-04-24 ENCOUNTER — Other Ambulatory Visit: Payer: Self-pay | Admitting: Gynecology

## 2016-04-24 DIAGNOSIS — R928 Other abnormal and inconclusive findings on diagnostic imaging of breast: Secondary | ICD-10-CM

## 2016-04-29 ENCOUNTER — Ambulatory Visit
Admission: RE | Admit: 2016-04-29 | Discharge: 2016-04-29 | Disposition: A | Discharge status: Home / Self Care | Payer: Medicare Other | Source: Ambulatory Visit | Attending: Gynecology | Admitting: Gynecology

## 2016-04-29 DIAGNOSIS — R928 Other abnormal and inconclusive findings on diagnostic imaging of breast: Secondary | ICD-10-CM

## 2016-05-11 ENCOUNTER — Encounter: Payer: Medicare Other | Admitting: Gynecology

## 2016-05-18 ENCOUNTER — Encounter: Payer: Self-pay | Admitting: Gynecology

## 2016-05-18 ENCOUNTER — Ambulatory Visit (INDEPENDENT_AMBULATORY_CARE_PROVIDER_SITE_OTHER): Payer: Medicare Other | Admitting: Gynecology

## 2016-05-18 VITALS — BP 116/78 | Ht 63.0 in | Wt 161.0 lb

## 2016-05-18 DIAGNOSIS — Z01419 Encounter for gynecological examination (general) (routine) without abnormal findings: Secondary | ICD-10-CM

## 2016-05-18 DIAGNOSIS — M810 Age-related osteoporosis without current pathological fracture: Secondary | ICD-10-CM

## 2016-05-18 DIAGNOSIS — N952 Postmenopausal atrophic vaginitis: Secondary | ICD-10-CM

## 2016-05-18 DIAGNOSIS — Z01411 Encounter for gynecological examination (general) (routine) with abnormal findings: Secondary | ICD-10-CM

## 2016-05-18 MED ORDER — ESTROGENS, CONJUGATED 0.625 MG/GM VA CREA: 1.0000 g | TOPICAL_CREAM | VAGINAL | 5 refills | Status: DC

## 2016-05-18 NOTE — Patient Instructions (Signed)
Follow up in one year, sooner if any issues 

## 2016-05-18 NOTE — Progress Notes (Signed)
    Pam Smith [birth year] [id number]        76 y.o.  G1P1001 for breast and pelvic exam  Past medical history,surgical history, problem list, medications, allergies, family history and social history were all reviewed and documented as reviewed in the EPIC chart.  ROS:  Performed with pertinent positives and negatives included in the history, assessment and plan.   Additional significant findings :  None   Exam: [name] assistant Vitals:   05/18/16 1359  BP: 116/78  Weight: 161 lb (73 kg)  Height: 5\' 3"  (1.6 m)   Body mass index is 28.52 kg/m.  General appearance:  Normal affect, orientation and appearance. Skin: Grossly normal HEENT: Without gross lesions.  No cervical or supraclavicular adenopathy. Thyroid normal.  Lungs:  Clear without wheezing, rales or rhonchi Cardiac: RR, without RMG Abdominal:  Soft, nontender, without masses, guarding, rebound, organomegaly or hernia Breasts:  Examined lying and sitting without masses, retractions, discharge or axillary adenopathy. Pelvic:  Ext, BUS, Vagina: With atrophic changes  Cervix: With atrophic changes  Uterus: Anteverted, normal size, shape and contour, midline and mobile nontender   Adnexa: Without masses or tenderness    Anus and perineum: Normal   Rectovaginal: Normal sphincter tone without palpated masses or tenderness.    Assessment/Plan:  76 y.o. G87P1001 female for breast and pelvic exam.   1. Postmenopausal/atrophic genital changes. Continues to use Premarin intermittently for vaginal dryness. Also finds decreased UTI symptoms. We have discussed the risks of absorption with systemic effects to include thrombosis and breast cancer as well as endometrial stimulation. She is comfortable using it and I refilled her times a year. No vaginal bleeding and she knows importance reporting vaginal bleeding. 2. Osteopenia/osteoporosis.  Actively being followed by her primary for this with a recent DEXA. Continues on  Fosamax by her history for to 5 years. She'll continue to follow up with her primary physician in reference to this as I have no records of this. 3. Pap smear 2015. No Pap smear done today. No history of significant abnormal Pap smears. We discussed current screening guidelines and she is comfortable with stop screening. 4. Mammography 04/2016. Continue with annual mammography when due. SBE monthly reviewed. 5. Colonoscopy 2010. Repeat at their recommended interval. 6. Health maintenance. No routine lab work done as patient does this elsewhere. Follow up in one year, sooner as needed.   [name] MD, 2:37 PM 05/18/2016

## 2016-07-21 DIAGNOSIS — M1712 Unilateral primary osteoarthritis, left knee: Secondary | ICD-10-CM | POA: Insufficient documentation

## 2016-09-21 ENCOUNTER — Other Ambulatory Visit: Payer: Self-pay | Admitting: Gynecology

## 2016-09-21 DIAGNOSIS — N6320 Unspecified lump in the left breast, unspecified quadrant: Secondary | ICD-10-CM

## 2016-10-23 DIAGNOSIS — I83893 Varicose veins of bilateral lower extremities with other complications: Secondary | ICD-10-CM | POA: Insufficient documentation

## 2016-10-23 DIAGNOSIS — R600 Localized edema: Secondary | ICD-10-CM | POA: Insufficient documentation

## 2016-10-30 ENCOUNTER — Other Ambulatory Visit: Payer: Self-pay | Admitting: Gynecology

## 2016-10-30 ENCOUNTER — Ambulatory Visit
Admission: RE | Admit: 2016-10-30 | Discharge: 2016-10-30 | Disposition: A | Discharge status: Home / Self Care | Payer: Medicare Other | Source: Ambulatory Visit | Attending: Gynecology | Admitting: Gynecology

## 2016-10-30 DIAGNOSIS — R928 Other abnormal and inconclusive findings on diagnostic imaging of breast: Secondary | ICD-10-CM

## 2016-10-30 DIAGNOSIS — N6320 Unspecified lump in the left breast, unspecified quadrant: Secondary | ICD-10-CM

## 2017-03-31 DIAGNOSIS — F419 Anxiety disorder, unspecified: Secondary | ICD-10-CM | POA: Insufficient documentation

## 2017-05-03 ENCOUNTER — Ambulatory Visit
Admission: RE | Admit: 2017-05-03 | Discharge: 2017-05-03 | Disposition: A | Discharge status: Home / Self Care | Payer: Medicare Other | Source: Ambulatory Visit | Attending: Gynecology | Admitting: Gynecology

## 2017-05-03 DIAGNOSIS — R928 Other abnormal and inconclusive findings on diagnostic imaging of breast: Secondary | ICD-10-CM

## 2017-06-15 ENCOUNTER — Encounter: Payer: Self-pay | Admitting: Gynecology

## 2017-06-15 ENCOUNTER — Ambulatory Visit (INDEPENDENT_AMBULATORY_CARE_PROVIDER_SITE_OTHER): Payer: Medicare Other | Admitting: Gynecology

## 2017-06-15 VITALS — BP 118/76 | Ht 62.0 in | Wt 142.0 lb

## 2017-06-15 DIAGNOSIS — N952 Postmenopausal atrophic vaginitis: Secondary | ICD-10-CM

## 2017-06-15 DIAGNOSIS — Z01419 Encounter for gynecological examination (general) (routine) without abnormal findings: Secondary | ICD-10-CM

## 2017-06-15 DIAGNOSIS — M810 Age-related osteoporosis without current pathological fracture: Secondary | ICD-10-CM

## 2017-06-15 NOTE — Patient Instructions (Signed)
Follow-up in 1 to 2 years as you feel appropriate.

## 2017-06-15 NOTE — Progress Notes (Signed)
    Pam Smith February 13, 1940 [id number]        76 y.o.  G1P1001 for breast and pelvic exam.  Doing well without gynecologic complaints.  Past medical history,surgical history, problem list, medications, allergies, family history and social history were all reviewed and documented as reviewed in the EPIC chart.  ROS:  Performed with pertinent positives and negatives included in the history, assessment and plan.   Additional significant findings : None   Exam: Pam Smith assistant Vitals:   06/15/17 0920  BP: 118/76  Weight: 142 lb (64.4 kg)  Height: 5\' 2"  (1.575 m)   Body mass index is 25.97 kg/m.  General appearance:  Normal affect, orientation and appearance. Skin: Grossly normal HEENT: Without gross lesions.  No cervical or supraclavicular adenopathy. Thyroid normal.  Lungs:  Clear without wheezing, rales or rhonchi Cardiac: RR, without RMG Abdominal:  Soft, nontender, without masses, guarding, rebound, organomegaly or hernia Breasts:  Examined lying and sitting without masses, retractions, discharge or axillary adenopathy. Pelvic:  Ext, BUS, Vagina: With atrophic changes  Cervix: With atrophic changes  Uterus: Anteverted, normal size, shape and contour, midline and mobile nontender   Adnexa: Without masses or tenderness    Anus and perineum: Normal   Rectovaginal: Normal sphincter tone without palpated masses or tenderness.    Assessment/Plan:  77 y.o. G18P1001 female for breast and pelvic exam.   1. Postmenopausal/atrophic genital changes.  Continues with Premarin vaginal cream intermittently for vaginal dryness.  We have discussed the risks of absorption multiple times and she is comfortable continuing.  Has supply but will call when needs more. 2. Osteoporosis.  Reports DEXA 2018.  Continues on Fosamax through her primary physician's office.  We will continue to follow-up with them in reference to bone health management. 3. Mammography 04/2017.  Continue with annual  mammography and when due.  Breast exam normal today. 4. Pap smear 2015.  No Pap smear done today.  No history of significant abnormal Pap smears.  Per current screening guidelines we both agree to stop screening based on age. 5. Colonoscopy 2011.  Repeat at their recommended interval. 6. Health maintenance.  No routine lab work done as patient does this elsewhere.  Patient had questions as to how often she needs to see me.  We reviewed current recommendations from various groups as far as screening annual exam recommendations.  At this point the patient is going to decide whether she wants to continue to see me every year.  She knows to follow-up though regardless if she has any issues whatsoever.  [name] MD, 10:17 AM 06/15/2017

## 2017-11-01 ENCOUNTER — Telehealth: Payer: Self-pay | Admitting: *Deleted

## 2017-11-01 MED ORDER — ESTROGENS, CONJUGATED 0.625 MG/GM VA CREA: 1.0000 g | TOPICAL_CREAM | VAGINAL | 2 refills | Status: DC

## 2017-11-01 NOTE — Telephone Encounter (Signed)
Patient uses premarin vaginal cream at [facility] they no longer can get this cream, the pharmacist called around to several location pharmacy's in this area and they cannot get Rx as well. I called [facility] and they can order medication and have it ready for her tomorrow, I informed patient with this and [facility] would [facility]. Per note on 06/15/17 rx sent.

## 2018-03-21 ENCOUNTER — Other Ambulatory Visit: Payer: Self-pay | Admitting: Gynecology

## 2018-03-21 DIAGNOSIS — N6489 Other specified disorders of breast: Secondary | ICD-10-CM

## 2018-05-06 ENCOUNTER — Inpatient Hospital Stay: Admission: RE | Admit: 2018-05-06 | Payer: Medicare Other | Source: Ambulatory Visit

## 2018-07-13 ENCOUNTER — Ambulatory Visit
Admission: RE | Admit: 2018-07-13 | Discharge: 2018-07-13 | Disposition: A | Discharge status: Home / Self Care | Payer: Medicare Other | Source: Ambulatory Visit | Attending: Gynecology | Admitting: Gynecology

## 2018-07-13 DIAGNOSIS — N6489 Other specified disorders of breast: Secondary | ICD-10-CM

## 2018-10-13 ENCOUNTER — Encounter: Payer: Self-pay | Admitting: Gynecology

## 2019-04-24 ENCOUNTER — Other Ambulatory Visit: Payer: Self-pay

## 2019-04-25 ENCOUNTER — Ambulatory Visit (INDEPENDENT_AMBULATORY_CARE_PROVIDER_SITE_OTHER): Payer: Medicare Other | Admitting: Obstetrics and Gynecology

## 2019-04-25 ENCOUNTER — Encounter: Payer: Self-pay | Admitting: Obstetrics and Gynecology

## 2019-04-25 VITALS — BP 122/82 | Ht 62.0 in | Wt 146.0 lb

## 2019-04-25 DIAGNOSIS — M810 Age-related osteoporosis without current pathological fracture: Secondary | ICD-10-CM

## 2019-04-25 DIAGNOSIS — Z01419 Encounter for gynecological examination (general) (routine) without abnormal findings: Secondary | ICD-10-CM | POA: Diagnosis not present

## 2019-04-25 DIAGNOSIS — N952 Postmenopausal atrophic vaginitis: Secondary | ICD-10-CM

## 2019-04-25 MED ORDER — ESTROGENS, CONJUGATED 0.625 MG/GM VA CREA: 1.0000 g | TOPICAL_CREAM | VAGINAL | 2 refills | Status: DC

## 2019-04-25 NOTE — Progress Notes (Signed)
Pam Smith 05-03-1940 [id number]  SUBJECTIVE:  79 y.o. G1P1001 female for annual routine gynecologic exam. She has no gynecologic concerns.  Current Outpatient Medications  Medication Sig Dispense Refill  . alendronate (FOSAMAX) 70 MG tablet Take 70 mg by mouth every 7 (seven) days. Take with a full glass of water on an empty stomach.    . Biotin 5000 MCG CAPS Take by mouth.    . Calcium-Magnesium-Vitamin D (CITRACAL CALCIUM+D PO) Take by mouth. 400mg +500 twice a day    . Cholecalciferol (VITAMIN D) 2000 UNITS tablet Take 2,000 Units by mouth daily.    Marland Kitchen conjugated estrogens (PREMARIN) vaginal cream Place 0.5 Applicatorfuls vaginally 2 (two) times a week. 42.5 g 2  . CRANBERRY FRUIT PO Take by mouth. Plus vitamin c    . desmopressin (DDAVP) 0.2 MG tablet Take 0.2 mg by mouth daily.    . hydroquinone 4 % cream Apply topically 2 (two) times daily.    Marland Kitchen ibuprofen (ADVIL,MOTRIN) 600 MG tablet Take 600 mg by mouth every 6 (six) hours as needed for pain.    . rosuvastatin (CRESTOR) 20 MG tablet Take 20 mg by mouth daily.     No current facility-administered medications for this visit.   Allergies: Patient has no known allergies.  No LMP recorded. Patient is postmenopausal.  Past medical history,surgical history, problem list, medications, allergies, family history and social history were all reviewed and documented as reviewed in the EPIC chart.  ROS:  Feeling well. No dyspnea or chest pain on exertion.  No abdominal pain, change in bowel habits, black or bloody stools.  No urinary tract symptoms. GYN ROS: no abnormal bleeding, pelvic pain or discharge, no breast pain or new or enlarging lumps on self exam. No neurological complaints.   OBJECTIVE:  BP 122/82   Ht 5\' 2"  (1.575 m)   Wt 146 lb (66.2 kg)   BMI 26.70 kg/m  The patient appears well, alert, oriented x 3, in no distress. ENT normal.  Neck supple. No cervical or supraclavicular adenopathy or thyromegaly.  Lungs are  clear, good air entry, no wheezes, rhonchi or rales. S1 and S2 normal, no murmurs, regular rate and rhythm.  Abdomen soft without tenderness, guarding, mass or organomegaly.  Neurological is normal, no focal findings.  BREAST EXAM: breasts appear normal, no suspicious masses, no skin or nipple changes or axillary nodes  PELVIC EXAM: VULVA: normal appearing vulva with no masses, tenderness or lesions, atrophic changes noted, VAGINA: normal appearing vagina with normal color and discharge, no lesions, CERVIX: normal appearing atrophic cervix without discharge or lesions, UTERUS: uterus is small/normal size, shape, consistency and nontender, ADNEXA: normal adnexa in size, nontender and no masses  Chaperone: [name] present during the examination  ASSESSMENT:  79 y.o. G1P1001 here for annual gynecologic exam  PLAN:   1. Postmenopausal.  Remains on intermittent Premarin vaginal cream use for vaginal dryness, 2 nights per week, she understands risks of estrogen use with absorption possible through the vaginal cream and she would like to continue use.  Refill x1 year provided. 2. Pap smear 04/2013.  No significant history of abnormal Pap smears.  We discussed that she may choose to discontinue screening based on expert recommendations based on criteria of age and normal Pap smear history, and she agrees to stop screening at this point. 3. Mammogram 07/2018.  Normal breast exam today.  She is reminded to schedule an annual mammogram this year when due. 4. Colonoscopy 2011.  Recommended that she  follow up at the recommended interval.  She says she is coordinating with her PCP regarding getting this done soon and she feels comfortable to do it in the current pandemic. 5.  Osteoporosis.  Continues on Fosamax, managed by her primary care doctor.  DEXA reported in 2020 or so, we do not have records to review.  She indicates that overall things seemed stable in regards to BMD. 6. Health maintenance.  No labs  today as she normally has these completed elsewhere.  Return 1-2 years or sooner, prn.  Joseph Pierini MD 04/25/19

## 2019-08-23 ENCOUNTER — Other Ambulatory Visit: Payer: Self-pay | Admitting: Obstetrics and Gynecology

## 2019-08-23 DIAGNOSIS — Z1231 Encounter for screening mammogram for malignant neoplasm of breast: Secondary | ICD-10-CM

## 2019-08-25 ENCOUNTER — Telehealth: Payer: Self-pay | Admitting: *Deleted

## 2019-08-25 NOTE — Telephone Encounter (Signed)
Patient called requesting OTC recommendations to help with external itching. I told her could use OTC Monistat externally and internal if needed. If no improvement, will need to schedule an office visit.

## 2019-09-01 DIAGNOSIS — N1831 Chronic kidney disease, stage 3a: Secondary | ICD-10-CM | POA: Insufficient documentation

## 2019-09-06 ENCOUNTER — Ambulatory Visit
Admission: RE | Admit: 2019-09-06 | Discharge: 2019-09-06 | Disposition: A | Discharge status: Home / Self Care | Payer: Medicare Other | Source: Ambulatory Visit | Attending: Obstetrics and Gynecology | Admitting: Obstetrics and Gynecology

## 2019-09-06 ENCOUNTER — Other Ambulatory Visit: Payer: Self-pay

## 2019-09-06 DIAGNOSIS — Z1231 Encounter for screening mammogram for malignant neoplasm of breast: Secondary | ICD-10-CM

## 2019-10-20 DIAGNOSIS — K5730 Diverticulosis of large intestine without perforation or abscess without bleeding: Secondary | ICD-10-CM | POA: Insufficient documentation

## 2020-02-04 DIAGNOSIS — D508 Other iron deficiency anemias: Secondary | ICD-10-CM | POA: Insufficient documentation

## 2020-04-05 DIAGNOSIS — N950 Postmenopausal bleeding: Secondary | ICD-10-CM

## 2020-04-05 HISTORY — DX: Postmenopausal bleeding: N95.0

## 2020-04-16 DIAGNOSIS — M952 Other acquired deformity of head: Secondary | ICD-10-CM | POA: Insufficient documentation

## 2020-04-25 ENCOUNTER — Ambulatory Visit: Payer: Medicare Other | Admitting: Obstetrics & Gynecology

## 2020-04-29 ENCOUNTER — Other Ambulatory Visit: Payer: Self-pay

## 2020-04-29 ENCOUNTER — Other Ambulatory Visit (HOSPITAL_COMMUNITY)
Admission: RE | Admit: 2020-04-29 | Discharge: 2020-04-29 | Disposition: A | Discharge status: Home / Self Care | Payer: Medicare Other | Source: Ambulatory Visit | Attending: Obstetrics & Gynecology | Admitting: Obstetrics & Gynecology

## 2020-04-29 ENCOUNTER — Encounter: Payer: Self-pay | Admitting: Obstetrics & Gynecology

## 2020-04-29 ENCOUNTER — Ambulatory Visit (INDEPENDENT_AMBULATORY_CARE_PROVIDER_SITE_OTHER): Payer: Medicare Other | Admitting: Obstetrics & Gynecology

## 2020-04-29 VITALS — BP 134/86 | Ht 61.5 in | Wt 144.0 lb

## 2020-04-29 DIAGNOSIS — N950 Postmenopausal bleeding: Secondary | ICD-10-CM | POA: Diagnosis present

## 2020-04-29 DIAGNOSIS — N952 Postmenopausal atrophic vaginitis: Secondary | ICD-10-CM

## 2020-04-29 DIAGNOSIS — Z1151 Encounter for screening for human papillomavirus (HPV): Secondary | ICD-10-CM | POA: Insufficient documentation

## 2020-04-29 DIAGNOSIS — Z01419 Encounter for gynecological examination (general) (routine) without abnormal findings: Secondary | ICD-10-CM | POA: Insufficient documentation

## 2020-04-29 DIAGNOSIS — M810 Age-related osteoporosis without current pathological fracture: Secondary | ICD-10-CM | POA: Diagnosis not present

## 2020-04-29 NOTE — Progress Notes (Addendum)
Pam Smith [birth year] [id number]   History:    80 y.o. G1P1L1 Married   RP:  Established patient presenting for annual gyn exam   HPI: Postmenopausal, well on no systemic HRT.  Using Estrogen cream twice a week. Very light PMB x 1 in  03/2020.  The spotting was preceeded by pelvic cramping.  Currently having mild pelvic discomfort.  Scheduled for CT by urology given crystals/blood in urine. BMs normal.  Osteoporosis.  Was on Fosamax, but stopped because of the possibility of necrosis of the jaw, pending CT of jaw.  Breasts normal.  BMI 26.77.  Active.  Health labs with Fam MD.  Past medical history,surgical history, family history and social history were all reviewed and documented in the EPIC chart.  Gynecologic History No LMP recorded. Patient is postmenopausal.   Obstetric History OB History  Gravida Para Term Preterm AB Living  1 1 1     1   SAB IAB Ectopic Multiple Live Births               # Outcome Date GA Lbr Len/2nd Weight Sex Delivery Anes PTL Lv  1 Term              ROS: A ROS was performed and pertinent positives and negatives are included in the history.  GENERAL: No fevers or chills. HEENT: No change in vision, no earache, sore throat or sinus congestion. NECK: No pain or stiffness. CARDIOVASCULAR: No chest pain or pressure. No palpitations. PULMONARY: No shortness of breath, cough or wheeze. GASTROINTESTINAL: No abdominal pain, nausea, vomiting or diarrhea, melena or bright red blood per rectum. GENITOURINARY: No urinary frequency, urgency, hesitancy or dysuria. MUSCULOSKELETAL: No joint or muscle pain, no back pain, no recent trauma. DERMATOLOGIC: No rash, no itching, no lesions. ENDOCRINE: No polyuria, polydipsia, no heat or cold intolerance. No recent change in weight. HEMATOLOGICAL: No anemia or easy bruising or bleeding. NEUROLOGIC: No headache, seizures, numbness, tingling or weakness. PSYCHIATRIC: No depression, no loss of interest in normal activity or  change in sleep pattern.     Exam:   BP 134/86   Ht 5' 1.5" (1.562 m)   Wt 144 lb (65.3 kg)   BMI 26.77 kg/m   Body mass index is 26.77 kg/m.  General appearance : Well developed well nourished female. No acute distress HEENT: Eyes: no retinal hemorrhage or exudates,  Neck supple, trachea midline, no carotid bruits, no thyroidmegaly Lungs: Clear to auscultation, no rhonchi or wheezes, or rib retractions  Heart: Regular rate and rhythm, no murmurs or gallops Breast:Examined in sitting and supine position were symmetrical in appearance, no palpable masses or tenderness,  no skin retraction, no nipple inversion, no nipple discharge, no skin discoloration, no axillary or supraclavicular lymphadenopathy Abdomen: no palpable masses or tenderness, no rebound or guarding Extremities: no edema or skin discoloration or tenderness  Pelvic: Vulva: Normal             Vagina: No gross lesions or discharge  Cervix: No gross lesions or discharge. No blood. Pap reflex done.  Uterus  AV, normal size, shape and consistency, non-tender and mobile  Adnexa  Without masses or tenderness  Anus: Normal   Assessment/Plan:  80 y.o. female for annual exam   1. Encounter for routine gynecological examination with Papanicolaou smear of cervix Normal gynecologic exam in menopause.  Pap reflex done.  Breasts normal.  Screening mammo 09/2019 Negative.  BMI 26.77.  Health labs with [facility]. - Cytology -  PAP( [place])  2. Postmenopausal atrophic vaginitis Will wait on Pelvic US results before deciding on continuing or not with Estrogen cream.  3. Postmenopausal bleeding Very light vaginal bleeding x 1 with pelvic cramping/discomfort. Normal gyn exam today.  Pap test done.  Will further investigate with a Pelvic US/Possible EBx. - US Transvaginal Non-OB; Future - Cytology - PAP( [place])  4. Osteoporosis, unspecified osteoporosis type, unspecified pathological fracture presence BD 2021.  Followed by Heriberto Antigua MD.  Stopped Fosamax recently.  Will R/O Necrosis of the jaw with a CT.    Other orders - loratadine (CLARITIN) 10 MG tablet; Take 10 mg by mouth daily.  Princess Bruins MD, 3:24 PM 04/29/2020

## 2020-04-30 ENCOUNTER — Encounter: Payer: Self-pay | Admitting: Obstetrics & Gynecology

## 2020-04-30 NOTE — Addendum Note (Signed)
Addended by: Princess Bruins on: 04/30/2020 07:21 AM   Modules accepted: Level of Service

## 2020-05-06 LAB — CYTOLOGY - PAP
Comment: NEGATIVE
Diagnosis: UNDETERMINED — AB
High risk HPV: NEGATIVE

## 2020-05-07 DIAGNOSIS — M160 Bilateral primary osteoarthritis of hip: Secondary | ICD-10-CM | POA: Insufficient documentation

## 2020-05-07 DIAGNOSIS — M519 Unspecified thoracic, thoracolumbar and lumbosacral intervertebral disc disorder: Secondary | ICD-10-CM | POA: Insufficient documentation

## 2020-05-16 ENCOUNTER — Encounter: Payer: Self-pay | Admitting: Obstetrics & Gynecology

## 2020-05-16 ENCOUNTER — Ambulatory Visit (INDEPENDENT_AMBULATORY_CARE_PROVIDER_SITE_OTHER): Payer: Medicare Other

## 2020-05-16 ENCOUNTER — Ambulatory Visit (INDEPENDENT_AMBULATORY_CARE_PROVIDER_SITE_OTHER): Payer: Medicare Other | Admitting: Obstetrics & Gynecology

## 2020-05-16 ENCOUNTER — Other Ambulatory Visit: Payer: Self-pay

## 2020-05-16 VITALS — BP 132/76

## 2020-05-16 DIAGNOSIS — N950 Postmenopausal bleeding: Secondary | ICD-10-CM | POA: Diagnosis not present

## 2020-05-16 DIAGNOSIS — R9389 Abnormal findings on diagnostic imaging of other specified body structures: Secondary | ICD-10-CM

## 2020-05-16 DIAGNOSIS — N840 Polyp of corpus uteri: Secondary | ICD-10-CM

## 2020-05-16 NOTE — Progress Notes (Signed)
    Pam Smith [birth year] [id number]        80 y.o.  G1P1001   RP: PMB for Pelvic US  HPI: No PMB x last visit on 04/29/2020 when we noted: Postmenopausal, well on no systemic HRT.  Using Estrogen cream twice a week. Very light PMB x 1 in  03/2020.  The spotting was preceeded by pelvic cramping.  Currently having mild pelvic discomfort.  Scheduled for CT by urology given crystals/blood in urine. BMs normal.    OB History  Gravida Para Term Preterm AB Living  1 1 1     1   SAB IAB Ectopic Multiple Live Births               # Outcome Date GA Lbr Len/2nd Weight Sex Delivery Anes PTL Lv  1 Term             Past medical history,surgical history, problem list, medications, allergies, family history and social history were all reviewed and documented in the EPIC chart.   Directed ROS with pertinent positives and negatives documented in the history of present illness/assessment and plan.  Exam:  Vitals:   05/16/20 1456  BP: 132/76   General appearance:  Normal  Pelvic US today: T/V images.  Anteverted uterus atrophic with no myometrial mass.  The uterus is measured at 5.83 x 3.05 x 2.48 cm.  Echogenic and avascular endometrium with scant free fluid and echogenic mass measured at 8 x 6 mm with appearance of a feeder vessel, cannot rule out endometrial polyp.  Right ovary seen and atrophic.  Left ovary not seen, left adnexa is negative.  No adnexal mass.  No free fluid in the posterior cul-de-sac.   Assessment/Plan:  80 y.o. G1P1001   1. Postmenopausal bleeding Pelvic ultrasound findings thoroughly reviewed with patient.  Probable endometrial polyp.  Decision to proceed with hysteroscopy, excision of polyp with MyoSure and dilation and curettage.  Hysteroscopy pamphlet given.  We will schedule surgery and patient will follow-up for a preop visit.  2. Endometrial polyp As above.  Princess Bruins MD, 3:28 PM 05/16/2020

## 2020-05-18 ENCOUNTER — Encounter: Payer: Self-pay | Admitting: Obstetrics & Gynecology

## 2020-05-20 ENCOUNTER — Telehealth: Payer: Self-pay | Admitting: *Deleted

## 2020-05-20 NOTE — Telephone Encounter (Signed)
-----   Message from Princess Bruins, MD sent at 05/16/2020  3:29 PM EDT ----- Regarding: Schedule surgery Surgery:  Hysteroscopy, Myosure Excision of Endometrial Polyp, Dilation and Curettage  Diagnosis: Postmenopausal bleeding/Endometrial Polyp  Location: [facility]  Status: Outpatient  Time: 30 Minutes  Assistant: N/A  Urgency: First Available  Pre-Op Appointment: To Be Scheduled  Post-Op Appointment(s): 2 Weeks  Time Out Of Work: Day Of Surgery ONLY

## 2020-05-20 NOTE — Telephone Encounter (Signed)
Spoke with patient. Reviewed surgery dates. Patient request to proceed with surgery on 06/19/20.   Pre-op scheduled for 06/05/20 at 0830.   Advised patient I will return call to review further instructions once date and time confirmed. Patient agreeable.   Surgery request sent.  Routing to [employer]

## 2020-05-22 NOTE — Telephone Encounter (Signed)
Spoke with patient regarding surgery benefits. Patient acknowledges understanding of information presented. Patient is aware that benefits presented are professional benefits only. Patient is aware that once surgery is scheduled, the hospital will call with separate benefits. See account note.

## 2020-05-24 NOTE — Telephone Encounter (Signed)
Spoke with patient. Surgery date request confirmed.  Advised surgery is scheduled for 06/19/20 at [place], [facility] Surgery instruction sheet and hospital brochure reviewed, printed copy will be mailed. Pre-op OV 06/05/20.  Routing to provider. Encounter closed.   Cc: KimAlexis

## 2020-06-05 ENCOUNTER — Ambulatory Visit (INDEPENDENT_AMBULATORY_CARE_PROVIDER_SITE_OTHER): Payer: Medicare Other | Admitting: Obstetrics & Gynecology

## 2020-06-05 ENCOUNTER — Other Ambulatory Visit: Payer: Self-pay

## 2020-06-05 ENCOUNTER — Encounter: Payer: Self-pay | Admitting: Obstetrics & Gynecology

## 2020-06-05 ENCOUNTER — Ambulatory Visit: Payer: Self-pay | Admitting: Obstetrics & Gynecology

## 2020-06-05 VITALS — BP 132/80

## 2020-06-05 DIAGNOSIS — N950 Postmenopausal bleeding: Secondary | ICD-10-CM

## 2020-06-05 DIAGNOSIS — N840 Polyp of corpus uteri: Secondary | ICD-10-CM

## 2020-06-05 NOTE — Progress Notes (Signed)
    Pam Smith 03-Mar-1940 [id number]        80 y.o.  G1P1001   RP: Preop HSC/D+C/Myosure Excision  HPI: Postmenopause on no HRT.  PMB 03/2020 and last week with mild spotting and cramps.  Pelvic US 05/16/2020 showed a probable IU Polyp.   OB History  Gravida Para Term Preterm AB Living  1 1 1     1   SAB IAB Ectopic Multiple Live Births               # Outcome Date GA Lbr Len/2nd Weight Sex Delivery Anes PTL Lv  1 Term             Past medical history,surgical history, problem list, medications, allergies, family history and social history were all reviewed and documented in the EPIC chart.   Directed ROS with pertinent positives and negatives documented in the history of present illness/assessment and plan.  Exam:  Vitals:   06/05/20 0815  BP: 132/80   General appearance:  Normal  Pelvic US on 05/16/2020: T/V images.  Anteverted uterus atrophic with no myometrial mass.  The uterus is measured at 5.83 x 3.05 x 2.48 cm.  Echogenic and avascular endometrium with scant free fluid and echogenic mass measured at 8 x 6 mm with appearance of a feeder vessel, cannot rule out endometrial polyp.  Right ovary seen and atrophic.  Left ovary not seen, left adnexa is negative.  No adnexal mass.  No free fluid in the posterior cul-de-sac.   Assessment/Plan:  80 y.o. G1P1001   1. Postmenopausal bleeding Pelvic ultrasound findings reviewed with patient.  Probable endometrial polyp.  Decision to proceed with hysteroscopy, excision of polyp with MyoSure and dilation and curettage.  Hysteroscopy pamphlet given.  Preop preparation, surgical procedure with risks and postop precautions thoroughly reviewed with patient.  2. Endometrial polyp As above.                        Patient was counseled as to the risk of surgery to include the following:  1. Infection (prohylactic antibiotics will be administered)  2. DVT/Pulmonary Embolism (prophylactic pneumo compression stockings will be  used)  3.Trauma to internal organs requiring additional surgical procedure to repair any injury to internal organs requiring perhaps additional hospitalization days.  4.Hemmorhage requiring transfusion and blood products which carry risks such as anaphylactic reaction, hepatitis and AIDS  Patient had received literature information on the procedure scheduled and all her questions were answered and fully accepts all risk.   Princess Bruins MD, 8:39 AM 06/05/2020

## 2020-06-14 ENCOUNTER — Other Ambulatory Visit: Payer: Self-pay

## 2020-06-14 ENCOUNTER — Encounter (HOSPITAL_BASED_OUTPATIENT_CLINIC_OR_DEPARTMENT_OTHER): Payer: Self-pay | Admitting: Obstetrics & Gynecology

## 2020-06-14 NOTE — Progress Notes (Signed)
Spoke w/ via phone for pre-op interview--- Pt Lab needs dos----  CBC             Lab results------ no COVID test -----patient states asymptomatic no test needed Arrive at ------- 0630 on 06-19-2020 NPO after MN NO Solid Food.  Clear liquids from MN until---  0530 Med rec completed Medications to take morning of surgery ----- NONE Diabetic medication ----- n/a Patient instructed no nail polish to be worn day of surgery Patient instructed to bring photo id and insurance card day of surgery Patient aware to have Driver (ride ) / caregiver for 24 hours after surgery -- husband, Pam Smith Patient Special Instructions ----- n/a Pre-Op special Istructions -----n/a Patient verbalized understanding of instructions that were given at this phone interview. Patient denies shortness of breath, chest pain, fever, cough at this phone interview.

## 2020-06-18 NOTE — Anesthesia Preprocedure Evaluation (Addendum)
Anesthesia Evaluation  Patient identified by MRN, date of birth, ID band Patient awake    Reviewed: Allergy & Precautions, H&P , NPO status , Patient's Chart, lab work & pertinent test results  Airway Mallampati: III  TM Distance: >3 FB Neck ROM: Full    Dental no notable dental hx. (+) Teeth Intact, Dental Advisory Given   Pulmonary neg pulmonary ROS,    Pulmonary exam normal breath sounds clear to auscultation       Cardiovascular Exercise Tolerance: Good negative cardio ROS   Rhythm:Regular Rate:Normal     Neuro/Psych negative neurological ROS  negative psych ROS   GI/Hepatic negative GI ROS, Neg liver ROS,   Endo/Other  negative endocrine ROS  Renal/GU negative Renal ROS  negative genitourinary   Musculoskeletal  (+) Arthritis , Osteoarthritis,    Abdominal   Peds  Hematology negative hematology ROS (+)   Anesthesia Other Findings   Reproductive/Obstetrics negative OB ROS                            Anesthesia Physical Anesthesia Plan  ASA: 2  Anesthesia Plan: General   Post-op Pain Management:    Induction: Intravenous  PONV Risk Score and Plan: 4 or greater and Propofol infusion, Ondansetron and Dexamethasone  Airway Management Planned: LMA  Additional Equipment:   Intra-op Plan:   Post-operative Plan: Extubation in OR  Informed Consent: I have reviewed the patients History and Physical, chart, labs and discussed the procedure including the risks, benefits and alternatives for the proposed anesthesia with the patient or authorized representative who has indicated his/her understanding and acceptance.     Dental advisory given  Plan Discussed with: CRNA  Anesthesia Plan Comments:        Anesthesia Quick Evaluation

## 2020-06-19 ENCOUNTER — Encounter (HOSPITAL_BASED_OUTPATIENT_CLINIC_OR_DEPARTMENT_OTHER): Payer: Self-pay | Admitting: Obstetrics & Gynecology

## 2020-06-19 ENCOUNTER — Ambulatory Visit (HOSPITAL_BASED_OUTPATIENT_CLINIC_OR_DEPARTMENT_OTHER)
Admission: RE | Admit: 2020-06-19 | Discharge: 2020-06-19 | Disposition: A | Discharge status: Home / Self Care | Payer: Medicare Other | Attending: Obstetrics & Gynecology | Admitting: Obstetrics & Gynecology

## 2020-06-19 ENCOUNTER — Encounter (HOSPITAL_BASED_OUTPATIENT_CLINIC_OR_DEPARTMENT_OTHER)
Admission: RE | Disposition: A | Discharge status: Home / Self Care | Payer: Self-pay | Source: Home / Self Care | Attending: Obstetrics & Gynecology

## 2020-06-19 ENCOUNTER — Ambulatory Visit (HOSPITAL_BASED_OUTPATIENT_CLINIC_OR_DEPARTMENT_OTHER): Payer: Medicare Other | Admitting: Anesthesiology

## 2020-06-19 ENCOUNTER — Other Ambulatory Visit: Payer: Self-pay

## 2020-06-19 DIAGNOSIS — Z791 Long term (current) use of non-steroidal anti-inflammatories (NSAID): Secondary | ICD-10-CM | POA: Insufficient documentation

## 2020-06-19 DIAGNOSIS — Z79899 Other long term (current) drug therapy: Secondary | ICD-10-CM | POA: Diagnosis not present

## 2020-06-19 DIAGNOSIS — Z8249 Family history of ischemic heart disease and other diseases of the circulatory system: Secondary | ICD-10-CM | POA: Insufficient documentation

## 2020-06-19 DIAGNOSIS — N950 Postmenopausal bleeding: Secondary | ICD-10-CM | POA: Insufficient documentation

## 2020-06-19 DIAGNOSIS — Z8041 Family history of malignant neoplasm of ovary: Secondary | ICD-10-CM | POA: Insufficient documentation

## 2020-06-19 DIAGNOSIS — N840 Polyp of corpus uteri: Secondary | ICD-10-CM | POA: Diagnosis not present

## 2020-06-19 DIAGNOSIS — Z806 Family history of leukemia: Secondary | ICD-10-CM | POA: Diagnosis not present

## 2020-06-19 DIAGNOSIS — C543 Malignant neoplasm of fundus uteri: Secondary | ICD-10-CM | POA: Insufficient documentation

## 2020-06-19 DIAGNOSIS — Z833 Family history of diabetes mellitus: Secondary | ICD-10-CM | POA: Insufficient documentation

## 2020-06-19 HISTORY — DX: Presence of spectacles and contact lenses: Z97.3

## 2020-06-19 HISTORY — DX: Presence of external hearing-aid: Z97.4

## 2020-06-19 HISTORY — DX: Personal history of other diseases of the nervous system and sense organs: Z86.69

## 2020-06-19 HISTORY — PX: DILATATION & CURETTAGE/HYSTEROSCOPY WITH MYOSURE: SHX6511

## 2020-06-19 HISTORY — DX: Polyp of corpus uteri: N84.0

## 2020-06-19 HISTORY — DX: Unspecified osteoarthritis, unspecified site: M19.90

## 2020-06-19 LAB — CBC
HCT: 47.1 % — ABNORMAL HIGH (ref 36.0–46.0)
Hemoglobin: 15.7 g/dL — ABNORMAL HIGH (ref 12.0–15.0)
MCH: 29.8 pg (ref 26.0–34.0)
MCHC: 33.3 g/dL (ref 30.0–36.0)
MCV: 89.5 fL (ref 80.0–100.0)
Platelets: 186 10*3/uL (ref 150–400)
RBC: 5.26 MIL/uL — ABNORMAL HIGH (ref 3.87–5.11)
RDW: 14.4 % (ref 11.5–15.5)
WBC: 9.4 10*3/uL (ref 4.0–10.5)
nRBC: 0 % (ref 0.0–0.2)

## 2020-06-19 SURGERY — DILATATION & CURETTAGE/HYSTEROSCOPY WITH MYOSURE
Anesthesia: General | Site: Vagina

## 2020-06-19 MED ORDER — FENTANYL CITRATE (PF) 100 MCG/2ML IJ SOLN
INTRAMUSCULAR | Status: DC | PRN
  Administered 2020-06-19 (×2): 50 ug via INTRAVENOUS

## 2020-06-19 MED ORDER — LIDOCAINE HCL 1 % IJ SOLN
INTRAMUSCULAR | Status: DC | PRN
  Administered 2020-06-19: 10 mL

## 2020-06-19 MED ORDER — ACETAMINOPHEN 500 MG PO TABS
1000.0000 mg | ORAL_TABLET | Freq: Once | ORAL | Status: AC
  Administered 2020-06-19: 1000 mg via ORAL

## 2020-06-19 MED ORDER — ACETAMINOPHEN 500 MG PO TABS
ORAL_TABLET | ORAL | Status: AC
  Filled 2020-06-19: qty 2

## 2020-06-19 MED ORDER — LACTATED RINGERS IV SOLN: INTRAVENOUS | Status: DC

## 2020-06-19 MED ORDER — LIDOCAINE HCL (PF) 2 % IJ SOLN
INTRAMUSCULAR | Status: AC
  Filled 2020-06-19: qty 5

## 2020-06-19 MED ORDER — CEFAZOLIN SODIUM-DEXTROSE 2-4 GM/100ML-% IV SOLN
INTRAVENOUS | Status: AC
  Filled 2020-06-19: qty 100

## 2020-06-19 MED ORDER — ONDANSETRON HCL 4 MG/2ML IJ SOLN
INTRAMUSCULAR | Status: DC | PRN
  Administered 2020-06-19: 4 mg via INTRAVENOUS

## 2020-06-19 MED ORDER — FENTANYL CITRATE (PF) 100 MCG/2ML IJ SOLN
INTRAMUSCULAR | Status: AC
  Filled 2020-06-19: qty 2

## 2020-06-19 MED ORDER — FENTANYL CITRATE (PF) 100 MCG/2ML IJ SOLN: 25.0000 ug | INTRAMUSCULAR | Status: DC | PRN

## 2020-06-19 MED ORDER — LIDOCAINE 2% (20 MG/ML) 5 ML SYRINGE
INTRAMUSCULAR | Status: DC | PRN
  Administered 2020-06-19: 60 mg via INTRAVENOUS

## 2020-06-19 MED ORDER — PROPOFOL 10 MG/ML IV BOLUS
INTRAVENOUS | Status: DC | PRN
  Administered 2020-06-19: 120 mg via INTRAVENOUS

## 2020-06-19 MED ORDER — PROPOFOL 10 MG/ML IV BOLUS
INTRAVENOUS | Status: AC
  Filled 2020-06-19: qty 20

## 2020-06-19 MED ORDER — SODIUM CHLORIDE 0.9 % IR SOLN
Status: DC | PRN
  Administered 2020-06-19: 3000 mL

## 2020-06-19 MED ORDER — DEXAMETHASONE SODIUM PHOSPHATE 10 MG/ML IJ SOLN
INTRAMUSCULAR | Status: AC
  Filled 2020-06-19: qty 1

## 2020-06-19 MED ORDER — POVIDONE-IODINE 10 % EX SWAB: 2.0000 "application " | Freq: Once | CUTANEOUS | Status: DC

## 2020-06-19 MED ORDER — ONDANSETRON HCL 4 MG/2ML IJ SOLN
INTRAMUSCULAR | Status: AC
  Filled 2020-06-19: qty 2

## 2020-06-19 MED ORDER — DEXAMETHASONE SODIUM PHOSPHATE 10 MG/ML IJ SOLN
INTRAMUSCULAR | Status: DC | PRN
  Administered 2020-06-19: 10 mg via INTRAVENOUS

## 2020-06-19 MED ORDER — CEFAZOLIN SODIUM-DEXTROSE 2-4 GM/100ML-% IV SOLN
2.0000 g | INTRAVENOUS | Status: AC
  Administered 2020-06-19: 2 g via INTRAVENOUS

## 2020-06-19 SURGICAL SUPPLY — 24 items
CATH ROBINSON RED A/P 16FR (CATHETERS) IMPLANT
COVER WAND RF STERILE (DRAPES) ×2 IMPLANT
DEVICE MYOSURE LITE (MISCELLANEOUS) ×2 IMPLANT
DEVICE MYOSURE REACH (MISCELLANEOUS) IMPLANT
DILATOR CANAL MILEX (MISCELLANEOUS) ×2 IMPLANT
ELECT REM PT RETURN 9FT ADLT (ELECTROSURGICAL)
ELECTRODE REM PT RTRN 9FT ADLT (ELECTROSURGICAL) IMPLANT
GAUZE 4X4 16PLY RFD (DISPOSABLE) ×2 IMPLANT
GLOVE SURG ENC MOIS LTX SZ6.5 (GLOVE) ×2 IMPLANT
GLOVE SURG UNDER POLY LF SZ6.5 (GLOVE) ×4 IMPLANT
GLOVE SURG UNDER POLY LF SZ7 (GLOVE) ×4 IMPLANT
GLOVE SURG UNDER POLY LF SZ7.5 (GLOVE) ×2 IMPLANT
GOWN STRL REUS W/ TWL LRG LVL3 (GOWN DISPOSABLE) ×1 IMPLANT
GOWN STRL REUS W/TWL LRG LVL3 (GOWN DISPOSABLE) ×6 IMPLANT
IV NS IRRIG 3000ML ARTHROMATIC (IV SOLUTION) ×2 IMPLANT
KIT PROCEDURE FLUENT (KITS) ×2 IMPLANT
KIT TURNOVER CYSTO (KITS) ×2 IMPLANT
MYOSURE XL FIBROID (MISCELLANEOUS)
PACK VAGINAL MINOR WOMEN LF (CUSTOM PROCEDURE TRAY) ×2 IMPLANT
PAD OB MATERNITY 4.3X12.25 (PERSONAL CARE ITEMS) ×2 IMPLANT
PAD PREP 24X48 CUFFED NSTRL (MISCELLANEOUS) ×2 IMPLANT
SEAL CERVICAL OMNI LOK (ABLATOR) IMPLANT
SEAL ROD LENS SCOPE MYOSURE (ABLATOR) ×2 IMPLANT
SYSTEM TISS REMOVAL MYOSURE XL (MISCELLANEOUS) IMPLANT

## 2020-06-19 NOTE — Op Note (Addendum)
Operative Note  06/19/2020  9:01 AM  PATIENT:  Pam Smith  80 y.o. female  PRE-OPERATIVE DIAGNOSIS:  Postmenopausal bleeding, endometrial polyp  POST-OPERATIVE DIAGNOSIS:  Postmenopausal bleeding, endometrial polyp  PROCEDURE:  Procedure(s): DILATATION & CURETTAGE/HYSTEROSCOPY WITH MYOSURE ,EXCISION OF ENDOMETRIAL POLYP  SURGEON:  Surgeon(s): Princess Bruins, MD  ANESTHESIA:   general  FINDINGS: Intrauterine cavity with very small probable polyps x 2 and scar tissue. The endometrium appears atrophic.  Ostia normal.  DESCRIPTION OF OPERATION: Under general anesthesia with the laryngeal mask, the patient is in lithotomy position.  She is prepped with Betadine on the suprapubic, vulvar and vaginal areas.  Patient had voided just prior to entering the operating room.  The patient is draped as usual.  The vaginal exam reveals a small anteverted uterus with no adnexal mass.  The speculum is inserted in the vagina and the anterior lip of the cervix is grasped with a tenaculum.  The cervix is dilated with the os finder very gently.  The hysteroscope is then inserted in the intra uterine cavity.  Inspection reveals 2 normal ostia and 2 small probable polyps at the fundus.  Minimal scar tissue is present.  The endometrium otherwise appears atrophic.  Pictures are taken.  The MyoSure instrument is inserted.  Excision of the 2 probable small polyps.  Pictures are taken after excisions.  We then remove the hysteroscope with the MyoSure.  We proceeded with a systematic curettage of the endometrial cavity on all surfaces.  The 2 specimens are sent together to pathology.  The tenaculum is removed.  Hemostasis is adequate.  The speculum is removed.  The patient is brought to recovery room in good and stable status.  ESTIMATED BLOOD LOSS: 2 mL FlUID DEFICIT: 50 mL  Intake/Output Summary (Last 24 hours) at 06/19/2020 0901 Last data filed at 06/19/2020 0848 Gross per 24 hour  Intake 100 ml  Output  2 ml  Net 98 ml     BLOOD ADMINISTERED:none   LOCAL MEDICATIONS USED:  LIDOCAINE 1% 10 cc for Paracervical block  SPECIMEN:  Source of Specimen:  Excision material and endometrial curettings  DISPOSITION OF SPECIMEN:  PATHOLOGY  COUNTS:  YES  PLAN OF CARE: Transfer to PACU  Marie-Lyne LavoieMD9:01 AM

## 2020-06-19 NOTE — Discharge Instructions (Addendum)
Post Anesthesia Home Care Instructions  Activity: Get plenty of rest for the remainder of the day. A responsible individual must stay with you for 24 hours following the procedure.  For the next 24 hours, DO NOT: -Drive a car -Paediatric nurse -Drink alcoholic beverages -Take any medication unless instructed by your physician -Make any legal decisions or sign important papers.  Meals: Start with liquid foods such as gelatin or soup. Progress to regular foods as tolerated. Avoid greasy, spicy, heavy foods. If nausea and/or vomiting occur, drink only clear liquids until the nausea and/or vomiting subsides. Call your physician if vomiting continues.  Special Instructions/Symptoms: Your throat may feel dry or sore from the anesthesia or the breathing tube placed in your throat during surgery. If this causes discomfort, gargle with warm salt water. The discomfort should disappear within 24 hours.  Do not take any tylenol until after 1:00 pm today.  DISCHARGE INSTRUCTIONS: D&C / D&E The following instructions have been prepared to help you care for yourself upon your return home.   Personal hygiene:  Use sanitary pads for vaginal drainage, not tampons.  Shower the day after your procedure.  NO tub baths, pools or Jacuzzis for 2-3 weeks.  Wipe front to back after using the bathroom.  Activity and limitations:  Do NOT drive or operate any equipment for 24 hours. The effects of anesthesia are still present and drowsiness may result.  Do NOT rest in bed all day.  Walking is encouraged.  Walk up and down stairs slowly.  You may resume your normal activity in one to two days or as indicated by your physician.  Sexual activity: NO intercourse for at least 2 weeks after the procedure, or as indicated by your physician.  Diet: Eat a light meal as desired this evening. You may resume your usual diet tomorrow.  Return to work: You may resume your work activities in one to two days or as  indicated by your doctor.  What to expect after your surgery: Expect to have vaginal bleeding/discharge for 2-3 days and spotting for up to 10 days. It is not unusual to have soreness for up to 1-2 weeks. You may have a slight burning sensation when you urinate for the first day. Mild cramps may continue for a couple of days. You may have a regular period in 2-6 weeks.  Call your doctor for any of the following:  Excessive vaginal bleeding, saturating and changing one pad every hour.  Inability to urinate 6 hours after discharge from hospital.  Pain not relieved by pain medication.  Fever of 100.4 F or greater.  Unusual vaginal discharge or odor.
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
## 2020-06-19 NOTE — Transfer of Care (Signed)
IImmediate Anesthesia Transfer of Care Note  Patient: Pam Smith  Procedure(s) Performed: Procedure(s) (LRB): DILATATION & CURETTAGE/HYSTEROSCOPY WITH MYOSURE ,EXCISION OF ENDOMETRIAL POLYP (N/A)  Patient Location: PACU  Anesthesia Type: General  Level of Consciousness: awake, sedated, patient cooperative and responds to stimulation  Airway & Oxygen Therapy: Patient Spontanous Breathing and Patient connected to [place] 02 and soft FM   Post-op Assessment: Report given to PACU RN, Post -op Vital signs reviewed and stable and Patient moving all extremities  Post vital signs: Reviewed and stable  Complications: No apparent anesthesia complications

## 2020-06-19 NOTE — Anesthesia Procedure Notes (Signed)
Procedure Name: LMA Insertion Date/Time: 06/19/2020 8:29 AM Performed by: Rogers Blocker, CRNA Pre-anesthesia Checklist: Patient identified, Emergency Drugs available, Suction available and Patient being monitored Patient Re-evaluated:Patient Re-evaluated prior to induction Oxygen Delivery Method: Circle System Utilized Preoxygenation: Pre-oxygenation with 100% oxygen Induction Type: IV induction Ventilation: Mask ventilation without difficulty LMA: LMA inserted LMA Size: 4.0 Number of attempts: 1 Placement Confirmation: positive ETCO2 Tube secured with: Tape Dental Injury: Teeth and Oropharynx as per pre-operative assessment

## 2020-06-19 NOTE — Anesthesia Postprocedure Evaluation (Signed)
Anesthesia Post Note  Patient: Pam Smith  Procedure(s) Performed: DILATATION & CURETTAGE/HYSTEROSCOPY WITH MYOSURE ,EXCISION OF ENDOMETRIAL POLYP (Vagina )     Patient location during evaluation: PACU Anesthesia Type: General Level of consciousness: awake and alert Pain management: pain level controlled Vital Signs Assessment: post-procedure vital signs reviewed and stable Respiratory status: spontaneous breathing, nonlabored ventilation and respiratory function stable Cardiovascular status: blood pressure returned to baseline and stable Postop Assessment: no apparent nausea or vomiting Anesthetic complications: no   No notable events documented.  Last Vitals:  Vitals:   06/19/20 0930 06/19/20 0934  BP: (!) 174/90 (!) 184/93  Pulse: 74 71  Resp: 17 12  Temp:  (!) 36.4 C  SpO2: 95% 95%    Last Pain:  Vitals:   06/19/20 0930  TempSrc:   PainSc: 4                  Lowell Makara,W. EDMOND

## 2020-06-20 ENCOUNTER — Encounter (HOSPITAL_BASED_OUTPATIENT_CLINIC_OR_DEPARTMENT_OTHER): Payer: Self-pay | Admitting: Obstetrics & Gynecology

## 2020-06-20 ENCOUNTER — Telehealth: Payer: Self-pay | Admitting: *Deleted

## 2020-06-20 LAB — SURGICAL PATHOLOGY

## 2020-06-20 NOTE — Telephone Encounter (Signed)
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  Please schedule visit with me to discuss results and plan on Tuesday.

## 2020-06-20 NOTE — Telephone Encounter (Signed)
Patient informed to schedule office visit to discuss results on 05/25/20. Patient scheduled message sent to [facility] to put an appointment on hold for patient.

## 2020-06-21 NOTE — Telephone Encounter (Signed)
Patient scheduled on 6/24 at 9:45 am with Dr Berline Lopes, will have Dr.Lavoie relay to patient when she comes in for her appointment on 06/25/20.

## 2020-06-24 ENCOUNTER — Encounter: Payer: Self-pay | Admitting: Anesthesiology

## 2020-06-25 ENCOUNTER — Telehealth: Payer: Self-pay

## 2020-06-25 ENCOUNTER — Other Ambulatory Visit: Payer: Self-pay

## 2020-06-25 ENCOUNTER — Ambulatory Visit (INDEPENDENT_AMBULATORY_CARE_PROVIDER_SITE_OTHER): Payer: Medicare Other | Admitting: Obstetrics & Gynecology

## 2020-06-25 ENCOUNTER — Encounter: Payer: Self-pay | Admitting: Obstetrics & Gynecology

## 2020-06-25 VITALS — BP 140/86

## 2020-06-25 DIAGNOSIS — C541 Malignant neoplasm of endometrium: Secondary | ICD-10-CM

## 2020-06-25 NOTE — Telephone Encounter (Signed)
Patient saw Dr. Marguerita Merles today.  Patient states she has been referred to Dr. Berline Lopes to see her on 06/29/27.  Patient has a post op appt scheduled with Dr. Marguerita Merles on  07/03/20 but questions does she need to still keep that appointment with Dr ML?

## 2020-06-25 NOTE — Progress Notes (Signed)
    SHEENA DONEGAN Apr 28, 1940 [id number]        80 y.o.  G1P1001   RP: Postop HSC/Excision/D+C with Dx of focal well-differentiated endometrioid adenocarcinoma  HPI: Good postop evolution.  No vaginal bleeding currently.  No pelvic pain.  No fever.   OB History  Gravida Para Term Preterm AB Living  1 1 1     1   SAB IAB Ectopic Multiple Live Births               # Outcome Date GA Lbr Len/2nd Weight Sex Delivery Anes PTL Lv  1 Term             Past medical history,surgical history, problem list, medications, allergies, family history and social history were all reviewed and documented in the EPIC chart.   Directed ROS with pertinent positives and negatives documented in the history of present illness/assessment and plan.  Exam:  Vitals:   06/25/20 1011  BP: 140/86   General appearance:  Normal   FINAL MICROSCOPIC DIAGNOSIS:   A. 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  - See comment.   COMMENT:  There is a small focus with cribriform, back-to-back glands consistent  with focal FIGO grade 1 endometrioid adenocarcinoma.  There is also  extensive complex atypical hyperplasia.    Assessment/Plan:  80 y.o. G1P1001   1. 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  FIGO grade 1.  Counseling on endometrial cancer done.  Management discussed with patient.  Patient referred to gynecologic oncology with Dr. Berline Lopes on June 24 at the cancer center [facility] at 9:45 AM.  Patient voiced understanding and agreement with plan.   Princess Bruins MD, 10:56 AM 06/25/2020

## 2020-06-26 ENCOUNTER — Encounter: Payer: Self-pay | Admitting: Gynecologic Oncology

## 2020-06-26 NOTE — Telephone Encounter (Signed)
Spoke with patient and informed her. Appt cancelled.

## 2020-06-26 NOTE — Telephone Encounter (Signed)
Patient was informed with time and date by [facility] on 06/25/20.
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
## 2020-06-28 ENCOUNTER — Ambulatory Visit: Payer: Medicare Other | Admitting: Gynecologic Oncology

## 2020-06-28 ENCOUNTER — Telehealth: Payer: Self-pay | Admitting: *Deleted

## 2020-06-28 ENCOUNTER — Encounter: Payer: Self-pay | Admitting: Gynecologic Oncology

## 2020-06-28 NOTE — Telephone Encounter (Signed)
Spoke with the patient and rescheduled today's appt
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
## 2020-07-02 NOTE — Patient Instructions (Addendum)
Preparing for your Surgery  Plan for surgery on July 11, 2020 with Dr. Jeral Pinch at [facility]. You will be scheduled for a robotic assisted total laparoscopic hysterectomy (removal of the uterus and cervix), bilateral salpingo-oophorectomy (removal of both ovaries and fallopian tubes), sentinel lymph node biopsy, possible lymph node dissection, possible laparotomy (larger incision on your abdomen if needed).   Pre-operative Testing -You will receive a phone call from presurgical testing at [facility] to arrange for a pre-operative appointment and lab work.  -Bring your insurance card, copy of an advanced directive if applicable, medication list  -At that visit, you will be asked to sign a consent for a possible blood transfusion in case a transfusion becomes necessary during surgery.  The need for a blood transfusion is rare but having consent is a necessary part of your care.     -You should not be taking blood thinners or aspirin at least ten days prior to surgery unless instructed by your surgeon.  -Do not take supplements such as fish oil (omega 3), red yeast rice, turmeric before your surgery. You want to avoid medications with aspirin in them including headache powders such as BC or Goody's), Excedrin migraine.  -Avoid use of voltaren tablets and aleve for pain before surgery. If needed, you can use Tylenol for pain.  Day Before Surgery at [facility] [place] will be asked to take in a light diet the day before surgery. You will be advised you can have clear liquids up until 3 hours before your surgery.    Eat a light diet the day before surgery.  Examples including soups, broths, toast, yogurt, mashed potatoes.  AVOID GAS PRODUCING FOODS. Things to avoid include carbonated beverages (fizzy beverages, sodas), raw fruits and raw vegetables (uncooked), or beans.   If your bowels are filled with gas, your surgeon will have difficulty visualizing your pelvic  organs which increases your surgical risks.  Your role in recovery Your role is to become active as soon as directed by your doctor, while still giving yourself time to heal.  Rest when you feel tired. You will be asked to do the following in order to speed your recovery:  - Cough and breathe deeply. This helps to clear and expand your lungs and can prevent pneumonia after surgery.  - [place]. Do mild physical activity. Walking or moving your legs help your circulation and body functions return to normal. Do not try to get up or walk alone the first time after surgery.   -If you develop swelling on one leg or the other, pain in the back of your leg, redness/warmth in one of your legs, please call the office or go to the Emergency Room to have a doppler to rule out a blood clot. For shortness of breath, chest pain-seek care in the Emergency Room as soon as possible. - Actively manage your pain. Managing your pain lets you move in comfort. We will ask you to rate your pain on a scale of zero to 10. It is your responsibility to tell your doctor or nurse where and how much you hurt so your pain can be treated.  Special Considerations -If you are diabetic, you may be placed on insulin after surgery to have closer control over your blood sugars to promote healing and recovery.  This does not mean that you will be discharged on insulin.  If applicable, your oral antidiabetics will be resumed when you are  tolerating a solid diet.  -Your final pathology results from surgery should be available around one week after surgery and the results will be relayed to you when available.  -FMLA forms can be faxed to [phone] and please allow 5-7 business days for completion.  Pain Management After Surgery -You have been prescribed your pain medication and bowel regimen medications before surgery so that you can have these available when you are discharged from the hospital. The pain  medication is for use ONLY AFTER surgery and a new prescription will not be given.   -Make sure that you have Tylenol and Ibuprofen (or you can use the aleve or Voltaren tablets you have at home-you would not want to take these medications together since they work similarly) at home to use on a regular basis after surgery for pain control. We recommend alternating the medications every hour to six hours since they work differently and are processed in the body differently for pain relief.  -Review the attached handout on narcotic use and their risks and side effects.   Bowel Regimen -You have been prescribed Sennakot-S to take nightly to prevent constipation especially if you are taking the narcotic pain medication intermittently.  It is important to prevent constipation and drink adequate amounts of liquids. You can stop taking this medication when you are not taking pain medication and you are back on your normal bowel routine.  Risks of Surgery Risks of surgery are low but include bleeding, infection, damage to surrounding structures, re-operation, blood clots, and very rarely death.   Blood Transfusion Information (For the consent to be signed before surgery)  We will be checking your blood type before surgery so in case of emergencies, we will know what type of blood you would need.                                            WHAT IS A BLOOD TRANSFUSION?  A transfusion is the replacement of blood or some of its parts. Blood is made up of multiple cells which provide different functions. Red blood cells carry oxygen and are used for blood loss replacement. White blood cells fight against infection. Platelets control bleeding. Plasma helps clot blood. Other blood products are available for specialized needs, such as hemophilia or other clotting disorders. BEFORE THE TRANSFUSION  Who gives blood for transfusions?  You may be able to donate blood to be used at a later date on yourself  (autologous donation). Relatives can be asked to donate blood. This is generally not any safer than if you have received blood from a stranger. The same precautions are taken to ensure safety when a relative's blood is donated. Healthy volunteers who are fully evaluated to make sure their blood is safe. This is blood bank blood. Transfusion therapy is the safest it has ever been in the practice of medicine. Before blood is taken from a donor, a complete history is taken to make sure that person has no history of diseases nor engages in risky social behavior (examples are intravenous drug use or sexual activity with multiple partners). The donor's travel history is screened to minimize risk of transmitting infections, such as malaria. The donated blood is tested for signs of infectious diseases, such as HIV and hepatitis. The blood is then tested to be sure it is compatible with you in order to minimize the chance  of a transfusion reaction. If you or a relative donates blood, this is often done in anticipation of surgery and is not appropriate for emergency situations. It takes many days to process the donated blood. RISKS AND COMPLICATIONS Although transfusion therapy is very safe and saves many lives, the main dangers of transfusion include:  Getting an infectious disease. Developing a transfusion reaction. This is an allergic reaction to something in the blood you were given. Every precaution is taken to prevent this. The decision to have a blood transfusion has been considered carefully by your caregiver before blood is given. Blood is not given unless the benefits outweigh the risks.  AFTER SURGERY INSTRUCTIONS  Return to work: 4-6 weeks if applicable  Activity: 1. Be up and out of the bed during the day.  Take a nap if needed.  You may walk up steps but be careful and use the hand rail.  Stair climbing will tire you more than you think, you may need to stop part way and rest.   2. No lifting or  straining for 6 weeks over 10 pounds. No pushing, pulling, straining for 6 weeks.  3. No driving for 1 week(s).  Do not drive if you are taking narcotic pain medicine and make sure that your reaction time has returned.   4. You can shower as soon as the next day after surgery. Shower daily.  Use your regular soap and water (not directly on the incision) and pat your incision(s) dry afterwards; don't rub.  No tub baths or submerging your body in water until cleared by your surgeon. If you have the soap that was given to you by pre-surgical testing that was used before surgery, you do not need to use it afterwards because this can irritate your incisions.   5. No sexual activity and nothing in the vagina for 8 weeks.  6. You may experience a small amount of clear drainage from your incisions, which is normal.  If the drainage persists, increases, or changes color please call the office.  7. Do not use creams, lotions, or ointments such as neosporin on your incisions after surgery until advised by your surgeon because they can cause removal of the dermabond glue on your incisions.    8. You may experience vaginal spotting after surgery or around the 6-8 week mark from surgery when the stitches at the top of the vagina begin to dissolve.  The spotting is normal but if you experience heavy bleeding, call our office.  9. Take Tylenol or ibuprofen (or aleve or Voltaren) first for pain and only use narcotic pain medication for severe pain not relieved by the Tylenol or Ibuprofen.  Monitor your Tylenol intake to a max of 4,000 mg in a 24 hour period. You can alternate these medications after surgery.  Diet: 1. Low sodium Heart Healthy Diet is recommended but you are cleared to resume your normal (before surgery) diet after your procedure.  2. It is safe to use a laxative, such as Miralax or Colace, if you have difficulty moving your bowels. You have been prescribed Sennakot at bedtime every evening to keep  bowel movements regular and to prevent constipation.    Wound Care: 1. Keep clean and dry.  Shower daily.  Reasons to call the Doctor: Fever - Oral temperature greater than 100.4 degrees Fahrenheit Foul-smelling vaginal discharge Difficulty urinating Nausea and vomiting Increased pain at the site of the incision that is unrelieved with pain medicine. Difficulty breathing with or without  chest pain New calf pain especially if only on one side Sudden, continuing increased vaginal bleeding with or without clots.   Contacts: For questions or concerns you should contact:  Dr. Jeral Pinch at [phone]  Joylene John, NP at [phone]  After Hours: call [phone] and have the GYN Oncologist paged/contacted (after 5 pm or on the weekends).  Messages sent via mychart are for non-urgent matters and are not responded to after hours so for urgent needs, please call the after hours number.

## 2020-07-03 ENCOUNTER — Ambulatory Visit: Payer: Self-pay | Admitting: Obstetrics & Gynecology

## 2020-07-03 ENCOUNTER — Other Ambulatory Visit: Payer: Self-pay

## 2020-07-03 ENCOUNTER — Inpatient Hospital Stay: Payer: Medicare Other | Attending: Gynecologic Oncology | Admitting: Gynecologic Oncology

## 2020-07-03 ENCOUNTER — Inpatient Hospital Stay (HOSPITAL_BASED_OUTPATIENT_CLINIC_OR_DEPARTMENT_OTHER): Payer: Medicare Other | Admitting: Gynecologic Oncology

## 2020-07-03 ENCOUNTER — Encounter: Payer: Self-pay | Admitting: Gynecologic Oncology

## 2020-07-03 VITALS — BP 166/95 | HR 75 | Temp 97.5°F | Resp 18 | Ht 61.0 in | Wt 141.6 lb

## 2020-07-03 DIAGNOSIS — N183 Chronic kidney disease, stage 3 unspecified: Secondary | ICD-10-CM | POA: Insufficient documentation

## 2020-07-03 DIAGNOSIS — C541 Malignant neoplasm of endometrium: Secondary | ICD-10-CM

## 2020-07-03 DIAGNOSIS — M1990 Unspecified osteoarthritis, unspecified site: Secondary | ICD-10-CM | POA: Insufficient documentation

## 2020-07-03 DIAGNOSIS — M810 Age-related osteoporosis without current pathological fracture: Secondary | ICD-10-CM | POA: Diagnosis not present

## 2020-07-03 DIAGNOSIS — E785 Hyperlipidemia, unspecified: Secondary | ICD-10-CM | POA: Diagnosis not present

## 2020-07-03 MED ORDER — TRAMADOL HCL 50 MG PO TABS: 50.0000 mg | ORAL_TABLET | Freq: Four times a day (QID) | ORAL | 0 refills | Status: DC | PRN

## 2020-07-03 MED ORDER — SENNOSIDES-DOCUSATE SODIUM 8.6-50 MG PO TABS: 2.0000 | ORAL_TABLET | Freq: Every day | ORAL | 0 refills | Status: DC

## 2020-07-04 ENCOUNTER — Encounter (HOSPITAL_BASED_OUTPATIENT_CLINIC_OR_DEPARTMENT_OTHER): Payer: Self-pay | Admitting: Gynecologic Oncology

## 2020-07-04 ENCOUNTER — Other Ambulatory Visit: Payer: Self-pay

## 2020-07-04 DIAGNOSIS — C801 Malignant (primary) neoplasm, unspecified: Secondary | ICD-10-CM

## 2020-07-04 HISTORY — DX: Malignant (primary) neoplasm, unspecified: C80.1

## 2020-07-04 NOTE — Progress Notes (Signed)
Patient here with her husband for consultation with Dr. Jeral Pinch and for pre-operative discussion prior to her scheduled surgery on July 11, 2020. She is scheduled for a robotic assisted total laparoscopic hysterectomy (removal of the uterus and cervix), bilateral salpingo-oophorectomy (removal of both ovaries and fallopian tubes), sentinel lymph node biopsy, possible lymph node dissection, possible laparotomy (larger incision on your abdomen if needed). The surgery was discussed in detail.  See after visit summary for additional details. Visual aids used to discuss items related to surgery including sequential compression stockings, foley catheter, IV pump, multi-modal pain regimen including tylenol, photo of the surgical robot, female reproductive system to discuss surgery in detail.      Discussed post-op pain management in detail including the aspects of the enhanced recovery pathway.  Advised her that a new prescription would be sent in for tramadol and it is only to be used for after her upcoming surgery.  We discussed the use of tylenol post-op and to monitor for a maximum of 4,000 mg in a 24 hour period.  Also prescribed sennakot to be used after surgery and to hold if having loose stools.  Discussed bowel regimen in detail.     Discussed the use of heparin pre-op, SCDs, and measures to take at home to prevent DVT including frequent mobility.  Reportable signs and symptoms of DVT discussed. Post-operative instructions discussed and expectations for after surgery. Incisional care discussed as well including reportable signs and symptoms including erythema, drainage, wound separation.     10 minutes spent with the patient.  Verbalizing understanding of material discussed. No needs or concerns voiced at the end of the visit.   Advised patient and family to call for any needs.  Advised that her post-operative medications had been prescribed and could be picked up at any time.     This appointment  is included in the global surgical fee as pre-surgical testing and there is no charge.

## 2020-07-04 NOTE — Addendum Note (Signed)
Addended by: Lafonda Mosses on: 07/04/2020 11:06 AM   Modules accepted: Level of Service

## 2020-07-04 NOTE — Progress Notes (Signed)
Your procedure is scheduled on 07-11-2020  Report to [facility]   Call this number if you have problems the morning of surgery  [phone].   OUR ADDRESS IS [place].  WE ARE LOCATED IN [facility].  PLEASE BRING YOUR INSURANCE CARD AND PHOTO ID DAY OF SURGERY.  ONLY ONE PERSON ALLOWED IN FACILITY WAITING AREA.                                     REMEMBER: Eat a light diet the day before surgery.beans.   Examples including soups, broths, toast, yogurt, mashed potatoes.  Things to avoid include carbonated beverages (fizzy beverages), raw fruits and raw vegetables, or beans.  If your bowels are filled with gas, your surgeon will have difficulty visualizing your pelvic organs which increases your surgical risks.  CLEAR LIQUIDS FROM MIDNIGHT UNTIL 900 AM DAY OF SURGERY, NO CLEAR LIQUIDS AFTER 900 AM DAY OF SURGERY.    YOU MAY  BRUSH YOUR TEETH MORNING OF SURGERY AND RINSE YOUR MOUTH OUT, NO CHEWING GUM CANDY OR MINTS.    CLEAR LIQUID DIET   Foods Allowed                                                                     Foods Excluded  Coffee and tea, regular and decaf                             liquids that you cannot  Plain Jell-O any favor except red or purple                                           see through such as: Fruit ices (not with fruit pulp)                                     milk, soups, orange juice  Iced Popsicles                                    All solid food Cranberry, grape and apple juices Sports drinks like Gatorade Lightly seasoned clear broth or consume(fat free) Sugar, honey syrup  Sample Menu Breakfast                                Lunch                                     Supper Cranberry juice                    Beef broth  Chicken broth Jell-O                                     Grape juice                           Apple juice Coffee or tea                         Jell-O                                      Popsicle                                                Coffee or tea                        Coffee or tea  _____________________________________________________________________     TAKE THESE MEDICATIONS MORNING OF SURGERY WITH A SIP OF WATER: CERTRIZINE.  ONE VISITOR IS ALLOWED IN WAITING ROOM ONLY DAY OF SURGERY.  NO VISITOR MAY SPEND THE NIGHT.  VISITOR ARE ALLOWED TO STAY UNTIL 800 PM.                                    DO NOT WEAR JEWERLY, MAKE UP. DO NOT WEAR LOTIONS, POWDERS, PERFUMES OR NAIL POLISH. DO NOT SHAVE FOR 24 HOURS PRIOR TO DAY OF SURGERY. MEN MAY SHAVE FACE AND NECK. CONTACTS, GLASSES, OR DENTURES MAY NOT BE WORN TO SURGERY.                                    [facility] IS NOT RESPONSIBLE  FOR ANY BELONGINGS.                                                                    Marland Kitchen           [facility] - Preparing for Surgery Before surgery, you can play an important role.  Because skin is not sterile, your skin needs to be as free of germs as possible.  You can reduce the number of germs on your skin by washing with CHG (chlorahexidine gluconate) soap before surgery.  CHG is an antiseptic cleaner which kills germs and bonds with the skin to continue killing germs even after washing. Please DO NOT use if you have an allergy to CHG or antibacterial soaps.  If your skin becomes reddened/irritated stop using the CHG and inform your nurse when you arrive at Short Stay. Do not shave (including legs and underarms) for at least 48 hours prior to the first CHG shower.  You may shave your face/neck. Please follow these instructions carefully:  1.  Shower with CHG Soap the night  before surgery and the  morning of Surgery.  2.  If you choose to wash your hair, wash your hair first as usual with your  normal  shampoo.  3.  After you shampoo, rinse your hair and body thoroughly to remove the  shampoo.                            4.   Use CHG as you would any other liquid soap.  You can apply chg directly  to the skin and wash                      Gently with a scrungie or clean washcloth.  5.  Apply the CHG Soap to your body ONLY FROM THE NECK DOWN.   Do not use on face/ open                           Wound or open sores. Avoid contact with eyes, ears mouth and genitals (private parts).                       Wash face,  Genitals (private parts) with your normal soap.             6.  Wash thoroughly, paying special attention to the area where your surgery  will be performed.  7.  Thoroughly rinse your body with warm water from the neck down.  8.  DO NOT shower/wash with your normal soap after using and rinsing off  the CHG Soap.                9.  Pat yourself dry with a clean towel.            10.  Wear clean pajamas.            11.  Place clean sheets on your bed the night of your first shower and do not  sleep with pets. Day of Surgery : Do not apply any lotions/deodorants the morning of surgery.  Please wear clean clothes to the hospital/surgery center.  FAILURE TO FOLLOW THESE INSTRUCTIONS MAY RESULT IN THE CANCELLATION OF YOUR SURGERY PATIENT SIGNATURE_________________________________  NURSE SIGNATURE__________________________________  ________________________________________________________________________                                                        QUESTIONS Hansel Feinstein PRE OP NURSE PHONE [phone].

## 2020-07-04 NOTE — Progress Notes (Signed)
Spoke w/ via phone for pre-op interview---pt Lab needs dos---- none              Lab results------has lab appt 07-10-2020 at 1000 am COVID test -----patient states asymptomatic no test needed Arrive at -------1000 am 07-11-2020 NPO after MN NO Solid Food.  Light diet day before surgery.Clear liquids from MN until---900 am then npo Med rec completed Medications to take morning of surgery -----certrizine Diabetic medication -----n/a Patient instructed no nail polish to be worn day of surgery Patient instructed to bring photo id and insurance card day of surgery Patient aware to have Driver (ride ) / caregiver  spouse jim   for 24 hours after surgery  Patient Special Instructions -----none Pre-Op special Istructions -----none Patient verbalized understanding of instructions that were given at this phone interview. Patient denies shortness of breath, chest pain, fever, cough at this phone interview.   [facility] nephrology dr Mitzi Hansen gehrken 05-28-2020 on chart(ckd stage 3)

## 2020-07-09 ENCOUNTER — Other Ambulatory Visit: Payer: Self-pay

## 2020-07-09 NOTE — Progress Notes (Addendum)
Spoke with patient by phone patient exposed to covid on 07-02-2020 and covid test arranged for 07-10-2020 at 1130 am  Spoke with lacey at dr Berline Lopes office and she will make dr Berline Lopes aware covid test being done 07-10-2020

## 2020-07-10 ENCOUNTER — Telehealth: Payer: Self-pay

## 2020-07-10 ENCOUNTER — Encounter (HOSPITAL_COMMUNITY)
Admission: RE | Admit: 2020-07-10 | Discharge: 2020-07-10 | Disposition: A | Discharge status: Home / Self Care | Payer: Medicare Other | Source: Ambulatory Visit | Attending: Gynecologic Oncology | Admitting: Gynecologic Oncology

## 2020-07-10 ENCOUNTER — Other Ambulatory Visit (HOSPITAL_COMMUNITY)
Admission: RE | Admit: 2020-07-10 | Discharge: 2020-07-10 | Disposition: A | Discharge status: Home / Self Care | Payer: Medicare Other | Source: Ambulatory Visit | Attending: Gynecologic Oncology | Admitting: Gynecologic Oncology

## 2020-07-10 DIAGNOSIS — Z01812 Encounter for preprocedural laboratory examination: Secondary | ICD-10-CM | POA: Diagnosis not present

## 2020-07-10 DIAGNOSIS — Z20822 Contact with and (suspected) exposure to covid-19: Secondary | ICD-10-CM | POA: Diagnosis not present

## 2020-07-10 LAB — COMPREHENSIVE METABOLIC PANEL
ALT: 19 U/L (ref 0–44)
AST: 22 U/L (ref 15–41)
Albumin: 4.2 g/dL (ref 3.5–5.0)
Alkaline Phosphatase: 66 U/L (ref 38–126)
Anion gap: 9 (ref 5–15)
BUN: 14 mg/dL (ref 8–23)
CO2: 24 mmol/L (ref 22–32)
Calcium: 9.1 mg/dL (ref 8.9–10.3)
Chloride: 96 mmol/L — ABNORMAL LOW (ref 98–111)
Creatinine, Ser: 0.82 mg/dL (ref 0.44–1.00)
GFR, Estimated: >60 mL/min (ref >60)
Glucose, Bld: 93 mg/dL (ref 70–99)
Potassium: 4.3 mmol/L (ref 3.5–5.1)
Sodium: 129 mmol/L — ABNORMAL LOW (ref 135–145)
Total Bilirubin: 1.1 mg/dL (ref 0.3–1.2)
Total Protein: 7.1 g/dL (ref 6.5–8.1)

## 2020-07-10 LAB — URINALYSIS, ROUTINE W REFLEX MICROSCOPIC
Bilirubin Urine: NEGATIVE
Glucose, UA: NEGATIVE mg/dL
Hgb urine dipstick: NEGATIVE
Ketones, ur: NEGATIVE mg/dL
Leukocytes,Ua: NEGATIVE
Nitrite: NEGATIVE
Protein, ur: NEGATIVE mg/dL
Specific Gravity, Urine: 1.012 (ref 1.005–1.030)
pH: 7 (ref 5.0–8.0)

## 2020-07-10 LAB — CBC
HCT: 48.4 % — ABNORMAL HIGH (ref 36.0–46.0)
Hemoglobin: 16.2 g/dL — ABNORMAL HIGH (ref 12.0–15.0)
MCH: 29.9 pg (ref 26.0–34.0)
MCHC: 33.5 g/dL (ref 30.0–36.0)
MCV: 89.3 fL (ref 80.0–100.0)
Platelets: 208 K/uL (ref 150–400)
RBC: 5.42 MIL/uL — ABNORMAL HIGH (ref 3.87–5.11)
RDW: 14.8 % (ref 11.5–15.5)
WBC: 8.8 K/uL (ref 4.0–10.5)
nRBC: 0 % (ref 0.0–0.2)

## 2020-07-10 NOTE — Telephone Encounter (Signed)
Call to check on pt. preoperatively. Left message requesting a return call.

## 2020-07-11 ENCOUNTER — Other Ambulatory Visit: Payer: Self-pay

## 2020-07-11 ENCOUNTER — Encounter (HOSPITAL_BASED_OUTPATIENT_CLINIC_OR_DEPARTMENT_OTHER): Payer: Self-pay | Admitting: Gynecologic Oncology

## 2020-07-11 ENCOUNTER — Encounter (HOSPITAL_BASED_OUTPATIENT_CLINIC_OR_DEPARTMENT_OTHER)
Admission: RE | Disposition: A | Discharge status: Home / Self Care | Payer: Self-pay | Source: Home / Self Care | Attending: Gynecologic Oncology

## 2020-07-11 ENCOUNTER — Ambulatory Visit (HOSPITAL_BASED_OUTPATIENT_CLINIC_OR_DEPARTMENT_OTHER): Payer: Medicare Other | Admitting: Certified Registered Nurse Anesthetist

## 2020-07-11 ENCOUNTER — Ambulatory Visit (HOSPITAL_BASED_OUTPATIENT_CLINIC_OR_DEPARTMENT_OTHER)
Admission: RE | Admit: 2020-07-11 | Discharge: 2020-07-12 | Disposition: A | Discharge status: Home / Self Care | Payer: Medicare Other | Attending: Gynecologic Oncology | Admitting: Gynecologic Oncology

## 2020-07-11 DIAGNOSIS — N800 Endometriosis of uterus: Secondary | ICD-10-CM | POA: Insufficient documentation

## 2020-07-11 DIAGNOSIS — K5730 Diverticulosis of large intestine without perforation or abscess without bleeding: Secondary | ICD-10-CM | POA: Insufficient documentation

## 2020-07-11 DIAGNOSIS — N83202 Unspecified ovarian cyst, left side: Secondary | ICD-10-CM | POA: Diagnosis not present

## 2020-07-11 DIAGNOSIS — Z79899 Other long term (current) drug therapy: Secondary | ICD-10-CM | POA: Diagnosis not present

## 2020-07-11 DIAGNOSIS — C772 Secondary and unspecified malignant neoplasm of intra-abdominal lymph nodes: Secondary | ICD-10-CM | POA: Insufficient documentation

## 2020-07-11 DIAGNOSIS — N950 Postmenopausal bleeding: Secondary | ICD-10-CM | POA: Diagnosis not present

## 2020-07-11 DIAGNOSIS — C541 Malignant neoplasm of endometrium: Secondary | ICD-10-CM

## 2020-07-11 DIAGNOSIS — N83201 Unspecified ovarian cyst, right side: Secondary | ICD-10-CM | POA: Diagnosis not present

## 2020-07-11 DIAGNOSIS — Z7983 Long term (current) use of bisphosphonates: Secondary | ICD-10-CM | POA: Insufficient documentation

## 2020-07-11 DIAGNOSIS — N888 Other specified noninflammatory disorders of cervix uteri: Secondary | ICD-10-CM | POA: Diagnosis not present

## 2020-07-11 HISTORY — PX: ROBOTIC ASSISTED TOTAL HYSTERECTOMY WITH BILATERAL SALPINGO OOPHERECTOMY: SHX6086

## 2020-07-11 HISTORY — PX: SENTINEL NODE BIOPSY: SHX6608

## 2020-07-11 LAB — TYPE AND SCREEN
ABO/RH(D): A POS
Antibody Screen: NEGATIVE

## 2020-07-11 LAB — SARS CORONAVIRUS 2 (TAT 6-24 HRS): SARS Coronavirus 2: NEGATIVE

## 2020-07-11 LAB — ABO/RH: ABO/RH(D): A POS

## 2020-07-11 SURGERY — HYSTERECTOMY, TOTAL, ROBOT-ASSISTED, LAPAROSCOPIC, WITH BILATERAL SALPINGO-OOPHORECTOMY
Anesthesia: General | Site: Abdomen

## 2020-07-11 MED ORDER — SODIUM CHLORIDE 0.9 % IR SOLN
Status: DC | PRN
  Administered 2020-07-11: 1000 mL

## 2020-07-11 MED ORDER — ONDANSETRON HCL 4 MG/2ML IJ SOLN
INTRAMUSCULAR | Status: AC
  Filled 2020-07-11: qty 2

## 2020-07-11 MED ORDER — ONDANSETRON HCL 4 MG/2ML IJ SOLN
INTRAMUSCULAR | Status: DC | PRN
  Administered 2020-07-11: 4 mg via INTRAVENOUS

## 2020-07-11 MED ORDER — SODIUM CHLORIDE 0.9 % IV SOLN
INTRAVENOUS | Status: DC
Start: 2020-07-11 — End: 2020-07-12

## 2020-07-11 MED ORDER — ROCURONIUM BROMIDE 10 MG/ML (PF) SYRINGE
PREFILLED_SYRINGE | INTRAVENOUS | Status: AC
  Filled 2020-07-11: qty 10

## 2020-07-11 MED ORDER — DEXAMETHASONE SODIUM PHOSPHATE 10 MG/ML IJ SOLN
INTRAMUSCULAR | Status: AC
  Filled 2020-07-11: qty 1

## 2020-07-11 MED ORDER — FENTANYL CITRATE (PF) 100 MCG/2ML IJ SOLN
25.0000 ug | INTRAMUSCULAR | Status: DC | PRN
  Administered 2020-07-11 (×2): 50 ug via INTRAVENOUS

## 2020-07-11 MED ORDER — HEPARIN SODIUM (PORCINE) 5000 UNIT/ML IJ SOLN
5000.0000 [IU] | INTRAMUSCULAR | Status: AC
Start: 2020-07-11 — End: 2020-07-11
  Administered 2020-07-11: 5000 [IU] via SUBCUTANEOUS

## 2020-07-11 MED ORDER — FENTANYL CITRATE (PF) 100 MCG/2ML IJ SOLN
INTRAMUSCULAR | Status: DC | PRN
  Administered 2020-07-11 (×4): 50 ug via INTRAVENOUS

## 2020-07-11 MED ORDER — ACETAMINOPHEN 500 MG PO TABS
ORAL_TABLET | ORAL | Status: AC
  Filled 2020-07-11: qty 2

## 2020-07-11 MED ORDER — TRAMADOL HCL 50 MG PO TABS
50.0000 mg | ORAL_TABLET | Freq: Four times a day (QID) | ORAL | Status: DC | PRN
  Administered 2020-07-11 – 2020-07-12 (×2): 50 mg via ORAL

## 2020-07-11 MED ORDER — FENTANYL CITRATE (PF) 100 MCG/2ML IJ SOLN
INTRAMUSCULAR | Status: AC
  Filled 2020-07-11: qty 2

## 2020-07-11 MED ORDER — SODIUM CHLORIDE 0.9% FLUSH: 3.0000 mL | Freq: Two times a day (BID) | INTRAVENOUS | Status: DC

## 2020-07-11 MED ORDER — LACTATED RINGERS IV SOLN: INTRAVENOUS | Status: DC | PRN

## 2020-07-11 MED ORDER — BACITRACIN ZINC 500 UNIT/GM EX OINT
TOPICAL_OINTMENT | CUTANEOUS | Status: DC | PRN
  Administered 2020-07-11: 1 via TOPICAL

## 2020-07-11 MED ORDER — TRAMADOL HCL 50 MG PO TABS
ORAL_TABLET | ORAL | Status: AC
  Filled 2020-07-11: qty 1

## 2020-07-11 MED ORDER — DESMOPRESSIN ACETATE 0.1 MG PO TABS
0.2000 mg | ORAL_TABLET | Freq: Every day | ORAL | Status: DC
  Administered 2020-07-12: 0.2 mg via ORAL
  Filled 2020-07-11: qty 1

## 2020-07-11 MED ORDER — SODIUM CHLORIDE 0.9 % IV SOLN
250.0000 mL | INTRAVENOUS | Status: DC | PRN
Start: 2020-07-11 — End: 2020-07-12

## 2020-07-11 MED ORDER — INDOCYANINE GREEN 25 MG IV SOLR
INTRAVENOUS | Status: DC | PRN
  Administered 2020-07-11: 2.5 mg

## 2020-07-11 MED ORDER — DEXMEDETOMIDINE (PRECEDEX) IN NS 20 MCG/5ML (4 MCG/ML) IV SYRINGE
PREFILLED_SYRINGE | INTRAVENOUS | Status: AC
  Filled 2020-07-11: qty 5

## 2020-07-11 MED ORDER — SODIUM CHLORIDE 0.9% FLUSH: 3.0000 mL | INTRAVENOUS | Status: DC | PRN

## 2020-07-11 MED ORDER — DICLOFENAC SODIUM 75 MG PO TBEC
75.0000 mg | DELAYED_RELEASE_TABLET | Freq: Two times a day (BID) | ORAL | Status: DC | PRN
Start: 2020-07-11 — End: 2020-07-12
  Filled 2020-07-11: qty 1

## 2020-07-11 MED ORDER — OXYCODONE HCL 5 MG PO TABS: 5.0000 mg | ORAL_TABLET | ORAL | Status: DC | PRN

## 2020-07-11 MED ORDER — LIDOCAINE HCL (PF) 2 % IJ SOLN
INTRAMUSCULAR | Status: AC
  Filled 2020-07-11: qty 5

## 2020-07-11 MED ORDER — SUGAMMADEX SODIUM 200 MG/2ML IV SOLN
INTRAVENOUS | Status: DC | PRN
  Administered 2020-07-11: 200 mg via INTRAVENOUS

## 2020-07-11 MED ORDER — ACETAMINOPHEN 325 MG RE SUPP
650.0000 mg | RECTAL | Status: DC | PRN
Start: 2020-07-11 — End: 2020-07-12

## 2020-07-11 MED ORDER — SIMETHICONE 80 MG PO CHEW: 80.0000 mg | CHEWABLE_TABLET | Freq: Four times a day (QID) | ORAL | Status: DC | PRN

## 2020-07-11 MED ORDER — SENNA 8.6 MG PO TABS
1.0000 | ORAL_TABLET | Freq: Two times a day (BID) | ORAL | Status: DC
  Administered 2020-07-12: 8.6 mg via ORAL

## 2020-07-11 MED ORDER — CEFAZOLIN SODIUM-DEXTROSE 2-4 GM/100ML-% IV SOLN
INTRAVENOUS | Status: AC
  Filled 2020-07-11: qty 100

## 2020-07-11 MED ORDER — PROPOFOL 10 MG/ML IV BOLUS
INTRAVENOUS | Status: DC | PRN
  Administered 2020-07-11: 120 mg via INTRAVENOUS

## 2020-07-11 MED ORDER — CEFAZOLIN SODIUM-DEXTROSE 2-4 GM/100ML-% IV SOLN
2.0000 g | INTRAVENOUS | Status: AC
  Administered 2020-07-11: 2 g via INTRAVENOUS

## 2020-07-11 MED ORDER — ACETAMINOPHEN 500 MG PO TABS: 1000.0000 mg | ORAL_TABLET | ORAL | Status: AC

## 2020-07-11 MED ORDER — ONDANSETRON HCL 4 MG/2ML IJ SOLN
4.0000 mg | Freq: Four times a day (QID) | INTRAMUSCULAR | Status: DC | PRN
Start: 2020-07-11 — End: 2020-07-12
  Administered 2020-07-11: 4 mg via INTRAVENOUS

## 2020-07-11 MED ORDER — ONDANSETRON HCL 4 MG PO TABS
4.0000 mg | ORAL_TABLET | Freq: Four times a day (QID) | ORAL | Status: DC | PRN
Start: 2020-07-11 — End: 2020-07-12

## 2020-07-11 MED ORDER — DEXAMETHASONE SODIUM PHOSPHATE 10 MG/ML IJ SOLN
INTRAMUSCULAR | Status: DC | PRN
  Administered 2020-07-11: 10 mg via INTRAVENOUS

## 2020-07-11 MED ORDER — ROCURONIUM BROMIDE 10 MG/ML (PF) SYRINGE
PREFILLED_SYRINGE | INTRAVENOUS | Status: DC | PRN
  Administered 2020-07-11: 20 mg via INTRAVENOUS
  Administered 2020-07-11: 50 mg via INTRAVENOUS

## 2020-07-11 MED ORDER — OXYCODONE HCL 5 MG PO TABS
5.0000 mg | ORAL_TABLET | ORAL | Status: DC | PRN
  Administered 2020-07-12: 5 mg via ORAL

## 2020-07-11 MED ORDER — LIDOCAINE 2% (20 MG/ML) 5 ML SYRINGE
INTRAMUSCULAR | Status: DC | PRN
  Administered 2020-07-11: 60 mg via INTRAVENOUS

## 2020-07-11 MED ORDER — DEXAMETHASONE SODIUM PHOSPHATE 10 MG/ML IJ SOLN: 4.0000 mg | INTRAMUSCULAR | Status: AC

## 2020-07-11 MED ORDER — HEPARIN SODIUM (PORCINE) 5000 UNIT/ML IJ SOLN
INTRAMUSCULAR | Status: AC
  Filled 2020-07-11: qty 1

## 2020-07-11 MED ORDER — ACETAMINOPHEN 325 MG PO TABS
ORAL_TABLET | ORAL | Status: AC
  Filled 2020-07-11: qty 2

## 2020-07-11 MED ORDER — ACETAMINOPHEN 325 MG PO TABS
650.0000 mg | ORAL_TABLET | ORAL | Status: DC | PRN
Start: 2020-07-11 — End: 2020-07-12
  Administered 2020-07-11: 650 mg via ORAL

## 2020-07-11 MED ORDER — BUPIVACAINE HCL 0.25 % IJ SOLN
INTRAMUSCULAR | Status: DC | PRN
  Administered 2020-07-11: 20 mL

## 2020-07-11 MED ORDER — MORPHINE SULFATE (PF) 4 MG/ML IV SOLN: 2.0000 mg | INTRAVENOUS | Status: DC | PRN

## 2020-07-11 MED ORDER — STERILE WATER FOR INJECTION IJ SOLN
INTRAMUSCULAR | Status: DC | PRN
  Administered 2020-07-11: 10 mL

## 2020-07-11 SURGICAL SUPPLY — 66 items
ADH SKN CLS APL DERMABOND .7 (GAUZE/BANDAGES/DRESSINGS) ×2
AGENT HMST KT MTR STRL THRMB (HEMOSTASIS)
APL ESCP 34 STRL LF DISP (HEMOSTASIS)
APPLICATOR SURGIFLO ENDO (HEMOSTASIS) IMPLANT
BAG LAPAROSCOPIC 12 15 PORT 16 (BASKET) IMPLANT
BAG RETRIEVAL 12/15 (BASKET)
BAG SPEC RTRVL LRG 6X4 10 (ENDOMECHANICALS)
BLADE SURG 10 STRL SS (BLADE) IMPLANT
COVER BACK TABLE 60X90IN (DRAPES) ×3 IMPLANT
COVER TIP SHEARS 8 DVNC (MISCELLANEOUS) ×2 IMPLANT
COVER TIP SHEARS 8MM DA VINCI (MISCELLANEOUS) ×3
DECANTER SPIKE VIAL GLASS SM (MISCELLANEOUS) IMPLANT
DERMABOND ADVANCED (GAUZE/BANDAGES/DRESSINGS) ×1
DERMABOND ADVANCED .7 DNX12 (GAUZE/BANDAGES/DRESSINGS) ×2 IMPLANT
DRAPE ARM DVNC X/XI (DISPOSABLE) ×8 IMPLANT
DRAPE COLUMN DVNC XI (DISPOSABLE) ×2 IMPLANT
DRAPE DA VINCI XI ARM (DISPOSABLE) ×12
DRAPE DA VINCI XI COLUMN (DISPOSABLE) ×3
DRAPE SHEET LG 3/4 BI-LAMINATE (DRAPES) ×3 IMPLANT
DRAPE SURG IRRIG POUCH 19X23 (DRAPES) ×3 IMPLANT
ELECT REM PT RETURN 9FT ADLT (ELECTROSURGICAL) ×3
ELECTRODE REM PT RTRN 9FT ADLT (ELECTROSURGICAL) ×2 IMPLANT
GAUZE 4X4 16PLY ~~LOC~~+RFID DBL (SPONGE) ×3 IMPLANT
GLOVE SURG ENC MOIS LTX SZ6 (GLOVE) ×12 IMPLANT
GLOVE SURG ENC MOIS LTX SZ6.5 (GLOVE) ×3 IMPLANT
HOLDER FOLEY CATH W/STRAP (MISCELLANEOUS) ×3 IMPLANT
IRRIG SUCT STRYKERFLOW 2 WTIP (MISCELLANEOUS) ×3
IRRIGATION SUCT STRKRFLW 2 WTP (MISCELLANEOUS) ×2 IMPLANT
KIT PROCEDURE DA VINCI SI (MISCELLANEOUS)
KIT PROCEDURE DVNC SI (MISCELLANEOUS) IMPLANT
KIT TURNOVER CYSTO (KITS) ×3 IMPLANT
LEGGING LITHOTOMY PAIR STRL (DRAPES) ×3 IMPLANT
MANIPULATOR UTERINE 4.5 ZUMI (MISCELLANEOUS) ×3 IMPLANT
NDL SAFETY ECLIPSE 18X1.5 (NEEDLE) IMPLANT
NEEDLE HYPO 18GX1.5 SHARP (NEEDLE)
NEEDLE HYPO 22GX1.5 SAFETY (NEEDLE) ×3 IMPLANT
NEEDLE SPNL 18GX3.5 QUINCKE PK (NEEDLE) IMPLANT
OBTURATOR OPTICAL STANDARD 8MM (TROCAR) ×3
OBTURATOR OPTICAL STND 8 DVNC (TROCAR) ×2
OBTURATOR OPTICALSTD 8 DVNC (TROCAR) ×2 IMPLANT
PACK ROBOT GYN CUSTOM WL (TRAY / TRAY PROCEDURE) ×3 IMPLANT
PACK ROBOTIC GOWN (GOWN DISPOSABLE) ×3 IMPLANT
PAD POSITIONING PINK XL (MISCELLANEOUS) ×3 IMPLANT
PENCIL SMOKE EVACUATOR (MISCELLANEOUS) IMPLANT
PORT ACCESS TROCAR AIRSEAL 12 (TROCAR) ×2 IMPLANT
PORT ACCESS TROCAR AIRSEAL 5M (TROCAR) ×1
PORT LAP GEL ALEXIS MED 5-9CM (MISCELLANEOUS) IMPLANT
POUCH SPECIMEN RETRIEVAL 10MM (ENDOMECHANICALS) IMPLANT
SEAL CANN UNIV 5-8 DVNC XI (MISCELLANEOUS) ×6 IMPLANT
SEAL XI 5MM-8MM UNIVERSAL (MISCELLANEOUS) ×9
SET TRI-LUMEN FLTR TB AIRSEAL (TUBING) ×3 IMPLANT
SURGIFLO W/THROMBIN 8M KIT (HEMOSTASIS) IMPLANT
SUT MNCRL AB 4-0 PS2 18 (SUTURE) IMPLANT
SUT VIC AB 0 CT1 27 (SUTURE)
SUT VIC AB 0 CT1 27XBRD ANTBC (SUTURE) IMPLANT
SUT VIC AB 3-0 SH 27 (SUTURE)
SUT VIC AB 3-0 SH 27X BRD (SUTURE) IMPLANT
SUT VIC AB 4-0 PS2 18 (SUTURE) ×6 IMPLANT
SYR 10ML LL (SYRINGE) IMPLANT
SYR CONTROL 10ML LL (SYRINGE) ×6 IMPLANT
TRAP SPECIMEN MUCUS 40CC (MISCELLANEOUS) IMPLANT
TRAY FOLEY W/BAG SLVR 14FR LF (SET/KITS/TRAYS/PACK) ×3 IMPLANT
TUBE CONNECTING 12X1/4 (SUCTIONS) IMPLANT
UNDERPAD 30X36 HEAVY ABSORB (UNDERPADS AND DIAPERS) ×3 IMPLANT
WATER STERILE IRR 1000ML POUR (IV SOLUTION) ×3 IMPLANT
YANKAUER SUCT BULB TIP NO VENT (SUCTIONS) IMPLANT

## 2020-07-11 NOTE — Op Note (Addendum)
OPERATIVE NOTE  Pre-operative Diagnosis: endometrial cancer grade 1  Post-operative Diagnosis: same as above, diverticular disease  Operation: Robotic-assisted laparoscopic total hysterectomy with bilateral salpingo-oophorectomy, SLN biopsy   Surgeon: Jeral Pinch MD  Assistant Surgeon: Lahoma Crocker MD (an MD assistant was necessary for tissue manipulation, management of robotic instrumentation, retraction and positioning due to the complexity of the case and hospital policies).   Anesthesia: GET  Urine Output: 150cc  Operative Findings: On EUA, small mobile uterus, cervix flush with the vagina. Normal upper abdominal survey. Normal small and omentum. 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 Uterus 6cm, normal appearing. Atrophic appearing adnexa. Mapping successful to bilateral pelvic basins, presacral space. NO intra-abdominal or pelvic evidence of disease.  Estimated Blood Loss:  less than 50 mL      Total IV Fluids: see I&O flowsheet         Specimens: uterus, cervix, bilateral tubes and ovaries, right and left obturator SLNs, right presacral SLN         Complications:  None apparent; patient tolerated the procedure well.         Disposition: PACU - hemodynamically stable.  Procedure Details  The patient was seen in the Holding Room. The risks, benefits, complications, treatment options, and expected outcomes were discussed with the patient.  The patient concurred with the proposed plan, giving informed consent.  The site of surgery properly noted/marked. The patient was identified as Pam Smith and the procedure verified as a Robotic-assisted hysterectomy with bilateral salpingo oophorectomy with SLN biopsy.   After induction of anesthesia, the patient was draped and prepped in the usual sterile manner. Patient was placed in supine position after anesthesia and draped  and prepped in the usual sterile manner as follows: Her arms were tucked to her side with all appropriate precautions.  The shoulders were stabilized with padded shoulder blocks applied to the acromium processes.  The patient was placed in the semi-lithotomy position in [place].  The perineum and vagina were prepped with CholoraPrep. The patient was draped after the CholoraPrep had been allowed to dry for 3 minutes.  A Time Out was held and the above information confirmed.  The urethra was prepped with Betadine. Foley catheter was placed.  A sterile speculum was placed in the vagina.  The cervix was grasped with a single-tooth tenaculum. 2mg  total of ICG was injected into the cervical stroma at 2 and 9 o'clock with 1cc injected at a 1cm and 84mm depth (concentration 0.5mg /ml) in all locations. The cervix was dilated with Kennon Rounds dilators.  The ZUMI uterine manipulator with a medium colpotomizer ring was placed without difficulty.  A pneum occluder balloon was placed over the manipulator.  OG tube placement was confirmed and to suction.   Next, a 10 mm skin incision was made 1 cm below the subcostal margin in the midclavicular line.  The 5 mm Optiview port and scope was used for direct entry.  Opening pressure was under 10 mm CO2.  The abdomen was insufflated and the findings were noted as above.   At this point and all points during the procedure, the patient's intra-abdominal pressure did not exceed 15 mmHg. Next, an 8 mm skin incision was made superior to the umbilicus and a right and left port were placed about 8 cm lateral to the robot port on the right and left side.  A fourth arm was placed on the right.  The 5 mm assist trocar was exchanged for a 10-12 mm port. All ports were placed under direct visualization.  The patient was placed in steep Trendelenburg.  Bowel was folded away into the upper abdomen.  The robot was docked in the normal manner.  The right and left peritoneum were opened parallel  to the IP ligament to open the retroperitoneal spaces bilaterally. The round ligaments were transected. The SLN mapping was performed in bilateral pelvic basins. After identifying the ureters, the para rectal and paravesical spaces were opened up entirely with careful dissection below the level of the ureters bilaterally and to the depth of the uterine artery origin in order to skeletonize the uterine "web" and ensure visualization of all parametrial channels. The para-aortic basins were carefully exposed and evaluated for isolated para-aortic SLN's. Lymphatic channels were identified travelling to the following visualized sentinel lymph node's: right obturator, right presacral, left obturator. These SLN's were separated from their surrounding lymphatic tissue, removed and sent for permanent pathology.  The hysterectomy was started.  The ureter was again noted to be on the medial leaf of the broad ligament.  The peritoneum above the ureter was incised and stretched and the infundibulopelvic ligament was skeletonized, cauterized and cut.  The posterior peritoneum was taken down to the level of the KOH ring.  The anterior peritoneum was also taken down.  The bladder flap was created to the level of the KOH ring.  The uterine artery on the right side was skeletonized, cauterized and cut in the normal manner.  A similar procedure was performed on the left.  The colpotomy was made and the uterus, cervix, bilateral ovaries and tubes were amputated and delivered through the vagina.  Pedicles were inspected and excellent hemostasis was achieved.    The colpotomy at the vaginal cuff was closed with Vicryl on a CT1 needle in a running manner.  Irrigation was used and excellent hemostasis was achieved.  At this point in the procedure was completed.  Robotic instruments were removed under direct visulaization.  The robot was undocked. The fascia at the 10-12 mm port was closed with 0 Vicryl on a UR-5 needle.  The  subcuticular tissue was closed with 4-0 Vicryl and the skin was closed with 4-0 Monocryl in a subcuticular manner.  Dermabond was applied.    The vagina was swabbed with  minimal bleeding noted.   All sponge, lap and needle counts were correct x  3. Foley catheter was removed.   The patient was transferred to the recovery room in stable condition.  Jeral Pinch, MD

## 2020-07-11 NOTE — Transfer of Care (Signed)
Immediate Anesthesia Transfer of Care Note  Patient: Pam Smith  Procedure(s) Performed: XI ROBOTIC ASSISTED TOTAL HYSTERECTOMY WITH BILATERAL SALPINGO OOPHORECTOMY, PO (Abdomen) SENTINEL NODE BIOPSY (Abdomen)  Patient Location: PACU  Anesthesia Type:General  Level of Consciousness: awake, alert  and oriented  Airway & Oxygen Therapy: Patient Spontanous Breathing and Patient connected to face mask oxygen  Post-op Assessment: Report given to RN and Post -op Vital signs reviewed and stable  Post vital signs: Reviewed and stable  Last Vitals:  Vitals Value Taken Time  BP 143/108 07/11/20 1427  Temp    Pulse 73 07/11/20 1430  Resp 16 07/11/20 1430  SpO2 100 % 07/11/20 1430  Vitals shown include unvalidated device data.  Last Pain:  Vitals:   07/11/20 1050  TempSrc: Oral  PainSc: 0-No pain      Patients Stated Pain Goal: 3 (44/03/47 [id number])  Complications: No notable events documented.

## 2020-07-11 NOTE — Discharge Instructions (Addendum)
07/11/2020  YOU HAVE VOLTAREN AND ALEVE ON YOUR MEDICATION LIST. YOU WOULD NOT WANT TO TAKE THESE TOGETHER SINCE THEY WORK SIMILARLY. THIS APPLIES TO IBUPROFEN AS WELL. YOU WOULD NEED TO TAKE JUST ONE OF THESE MEDICATIONS TO ACT AS AN ANTIINFLAMMATORY.   Activity: 1. Be up and out of the bed during the day.  Take a nap if needed.  You may walk up steps but be careful and use the hand rail.  Stair climbing will tire you more than you think, you may need to stop part way and rest.   2. No lifting over 10 lbs or straining for 6 weeks.  3. No driving until you are off narcotics and feel that you can brake safely. For most people this is 1-2 weeks.  Do Not drive if you are taking narcotic pain medicine.  4. Shower daily.  Use soap and water on your incision and pat dry; don't rub. No tub baths or submerging your body in water until seen in the office for your post-op check.  5. No sexual activity and nothing in the vagina for 8 weeks.  Medications:  - Take ibuprofen and tylenol first line for pain control. Take these regularly (every 6 hours) to decrease the build up of pain.  - If necessary, for severe pain not relieved by ibuprofen, take tramadol.  - While taking tramadol you should take sennakot every night to reduce the likelihood of constipation. If this causes diarrhea, stop its use.  Diet: 1. Low sodium Heart Healthy Diet is recommended.  2. It is safe to use a laxative if you have difficulty moving your bowels. You can take the sennakot-S that was prescribed for the first several days after surgery until your bowels have returned to normal.  Wound Care: 1. Keep clean and dry.  Shower daily.  Reasons to call the Doctor:  Fever - Oral temperature greater than 100.4 degrees Fahrenheit Foul-smelling vaginal discharge Difficulty urinating Nausea and vomiting Increased pain at the site of the incision that is unrelieved with pain medicine. Difficulty breathing with or without chest  pain New calf pain especially if only on one side Sudden, continuing increased vaginal bleeding with or without clots.   Follow-up: 1. See Jeral Pinch in 3 weeks as scheduled.  Contacts: For questions or concerns you should contact:  Dr. Jeral Pinch at [phone]  After hours and on week-ends call [phone] and ask to speak to the physician on call for Gynecologic Oncology

## 2020-07-11 NOTE — Telephone Encounter (Signed)
Patient having surgery today with Dr. Jeral Pinch for endometrial cancer.

## 2020-07-11 NOTE — Anesthesia Postprocedure Evaluation (Signed)
Anesthesia Post Note  Patient: Pam Smith  Procedure(s) Performed: XI ROBOTIC ASSISTED TOTAL HYSTERECTOMY WITH BILATERAL SALPINGO OOPHORECTOMY, PO (Abdomen) SENTINEL NODE BIOPSY (Abdomen)     Patient location during evaluation: PACU Anesthesia Type: General Level of consciousness: awake Pain management: pain level controlled Vital Signs Assessment: post-procedure vital signs reviewed and stable Respiratory status: spontaneous breathing Cardiovascular status: stable Postop Assessment: no apparent nausea or vomiting Anesthetic complications: no   No notable events documented.  Last Vitals:  Vitals:   07/11/20 1050 07/11/20 1426  BP: (!) 158/77 (!) 143/108  Pulse: 88 78  Resp: 16 13  Temp: 36.6 C   SpO2: 98% 100%    Last Pain:  Vitals:   07/11/20 1050  TempSrc: Oral  PainSc: 0-No pain                 Rayna Brenner

## 2020-07-11 NOTE — Anesthesia Procedure Notes (Signed)
Procedure Name: Intubation Date/Time: 07/11/2020 12:18 PM Performed by: Georgeanne Nim, CRNA Pre-anesthesia Checklist: Patient identified, Patient being monitored, Timeout performed, Emergency Drugs available and Suction available Patient Re-evaluated:Patient Re-evaluated prior to induction Oxygen Delivery Method: Circle System Utilized Preoxygenation: Pre-oxygenation with 100% oxygen Induction Type: IV induction Ventilation: Mask ventilation without difficulty Laryngoscope Size: Miller and 2 Grade View: Grade I Tube type: Oral Tube size: 7.0 mm Number of attempts: 1 Airway Equipment and Method: stylet Placement Confirmation: ETT inserted through vocal cords under direct vision, positive ETCO2 and breath sounds checked- equal and bilateral Secured at: 21 cm Tube secured with: Tape Dental Injury: Teeth and Oropharynx as per pre-operative assessment

## 2020-07-11 NOTE — Anesthesia Preprocedure Evaluation (Signed)
Anesthesia Evaluation  Patient identified by MRN, date of birth, ID band Patient awake    Reviewed: Allergy & Precautions, NPO status , Patient's Chart, lab work & pertinent test results  Airway Mallampati: II  TM Distance: >3 FB     Dental   Pulmonary    breath sounds clear to auscultation       Cardiovascular negative cardio ROS   Rhythm:Regular Rate:Normal     Neuro/Psych Anxiety  Neuromuscular disease    GI/Hepatic Neg liver ROS, hiatal hernia,   Endo/Other    Renal/GU Renal disease     Musculoskeletal  (+) Arthritis ,   Abdominal   Peds  Hematology  (+) anemia ,   Anesthesia Other Findings   Reproductive/Obstetrics                             Anesthesia Physical Anesthesia Plan  ASA: 3  Anesthesia Plan: General   Post-op Pain Management:    Induction: Intravenous  PONV Risk Score and Plan: 3 and Ondansetron, Dexamethasone and Midazolam  Airway Management Planned: Oral ETT  Additional Equipment:   Intra-op Plan:   Post-operative Plan: Extubation in OR  Informed Consent: I have reviewed the patients History and Physical, chart, labs and discussed the procedure including the risks, benefits and alternatives for the proposed anesthesia with the patient or authorized representative who has indicated his/her understanding and acceptance.       Plan Discussed with: CRNA and Anesthesiologist  Anesthesia Plan Comments:         Anesthesia Quick Evaluation

## 2020-07-11 NOTE — Interval H&P Note (Signed)
History and Physical Interval Note:  07/11/2020 10:06 AM  Pam Smith  has presented today for surgery, with the diagnosis of ENDOMETRIAL CANCER.  The various methods of treatment have been discussed with the patient and family. After consideration of risks, benefits and other options for treatment, the patient has consented to  Procedure(s): XI ROBOTIC ASSISTED TOTAL HYSTERECTOMY WITH BILATERAL SALPINGO OOPHORECTOMY, POSSIBLE LAPAROTOMY (N/A) SENTINEL NODE BIOPSY (N/A) POSSIBLE LYMPH NODE DISSECTION (N/A) as a surgical intervention.  The patient's history has been reviewed, patient examined, no change in status, stable for surgery.  I have reviewed the patient's chart and labs.  Questions were answered to the patient's satisfaction.     Lafonda Mosses

## 2020-07-12 ENCOUNTER — Telehealth: Payer: Self-pay

## 2020-07-12 DIAGNOSIS — R110 Nausea: Secondary | ICD-10-CM

## 2020-07-12 DIAGNOSIS — C541 Malignant neoplasm of endometrium: Secondary | ICD-10-CM | POA: Diagnosis not present

## 2020-07-12 MED ORDER — SENNA 8.6 MG PO TABS
ORAL_TABLET | ORAL | Status: AC
  Filled 2020-07-12: qty 1

## 2020-07-12 MED ORDER — ONDANSETRON HCL 4 MG PO TABS: 4.0000 mg | ORAL_TABLET | Freq: Three times a day (TID) | ORAL | 0 refills | Status: DC | PRN

## 2020-07-12 MED ORDER — TRAMADOL HCL 50 MG PO TABS
ORAL_TABLET | ORAL | Status: AC
  Filled 2020-07-12: qty 1

## 2020-07-12 MED ORDER — OXYCODONE HCL 5 MG PO TABS
ORAL_TABLET | ORAL | Status: AC
  Filled 2020-07-12: qty 1

## 2020-07-12 NOTE — Telephone Encounter (Signed)
ENCOUNTER OPENED IN ERROR

## 2020-07-12 NOTE — Telephone Encounter (Signed)
Endometrial Ca.  Needs to stop Estradiol cream.

## 2020-07-12 NOTE — Discharge Summary (Addendum)
Physician Discharge Summary  Patient ID: Pam Smith MRN: [id number] DOB/AGE: [birth year] 80 y.o.  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    Hospital Course: On 07/11/2020, the patient underwent the following: Procedure(s): XI ROBOTIC ASSISTED TOTAL HYSTERECTOMY WITH BILATERAL SALPINGO OOPHORECTOMY, SENTINEL NODE BIOPSY with Dr. Jeral Pinch. The evening after surgery, she developed intermittent nausea with dry heaving, lightheadedness, and shaking. Vital signs were stable during this time. On POD1, she reported dry heaving after taking oxycodone for pain but no nausea after that time. She ambulated in the hall with assistance. No pain reported. She was discharged to home on postoperative day 1 tolerating a regular diet, passing flatus, voiding, pain controlled.   Consults: None  Significant Diagnostic Studies: None  Treatments: Surgery: see above  Discharge Exam (performed by Dr. Berline Lopes): Blood pressure (!) 157/78, pulse 75, temperature 98.3 F (36.8 C), resp. rate 14, height 5\' 1"  (1.549 m), weight 141 lb 1.6 oz (64 kg), SpO2 98 %. General appearance: alert, cooperative, and no distress Resp: clear to auscultation bilaterally Cardio: regular rate and rhythm, S1, S2 normal, no murmur, click, rub or gallop GI: soft, non-tender; bowel sounds normal; no masses,  no organomegaly Extremities: extremities normal, atraumatic, no cyanosis or edema Incision/Wound: Lap sites to the abdomen intact with mild ecchymosis with no active drainage noted.   Disposition: Discharge disposition: 01-Home or Self Care      Discharge Instructions     Call MD for:  difficulty breathing, headache or visual disturbances   Complete by: As directed    Call MD for:  extreme fatigue    Complete by: As directed    Call MD for:  hives   Complete by: As directed    Call MD for:  persistant dizziness or light-headedness   Complete by: As directed    Call MD for:  persistant nausea and vomiting   Complete by: As directed    Call MD for:  redness, tenderness, or signs of infection (pain, swelling, redness, odor or green/yellow discharge around incision site)   Complete by: As directed    Call MD for:  severe uncontrolled pain   Complete by: As directed    Call MD for:  temperature >100.4   Complete by: As directed    Diet - low sodium heart healthy   Complete by: As directed    Driving Restrictions   Complete by: As directed    No driving for around 1 week if you were cleared to drive before surgery.  Do not take narcotics and drive.   Increase activity slowly   Complete by: As directed    Lifting restrictions   Complete by: As directed    No lifting greater than 10 lbs, pushing, pulling, straining for 6 weeks after surgery.   No wound care   Complete by: As directed    Sexual Activity Restrictions   Complete by: As directed    No sexual activity, nothing in the vagina, for 8 weeks.      Allergies as of 07/12/2020       Reactions   Other    Elastic Band-Pulled skin off of one side/blister        Medication List     TAKE these medications    acetaminophen 500 MG tablet Commonly known as: TYLENOL  Take 500 mg by mouth every 6 (six) hours as needed.   alendronate 70 MG tablet Commonly known as: FOSAMAX Take 70 mg by mouth every 7 (seven) days. Take with a full glass of water on an empty stomach.  Takes Wednesday's   B-12 1000 MCG Caps Take 1 capsule by mouth daily.   Biotin 5000 MCG Caps Take 1 capsule by mouth daily.   cetirizine 10 MG tablet Commonly known as: ZYRTEC Take 10 mg by mouth daily.   CITRACAL CALCIUM+D 600-40-500 MG-MG-UNIT Tb24 Generic drug: Calcium-Magnesium-Vitamin D Take 1 tablet by mouth 2 (two) times daily.   desmopressin  0.2 MG tablet Commonly known as: DDAVP Take 0.2 mg by mouth at bedtime.   diclofenac 75 MG EC tablet Commonly known as: VOLTAREN Take 75 mg by mouth 2 (two) times daily as needed.   ICAPS AREDS 2 PO Take 1 tablet by mouth 2 (two) times daily.   naproxen sodium 220 MG tablet Commonly known as: ALEVE Take 220 mg by mouth as needed.   rosuvastatin 20 MG tablet Commonly known as: CRESTOR Take 20 mg by mouth at bedtime.   senna-docusate 8.6-50 MG tablet Commonly known as: Senokot-S Take 2 tablets by mouth at bedtime. For AFTER surgery, do not take if having diarrhea   traMADol 50 MG tablet Commonly known as: ULTRAM Take 1 tablet (50 mg total) by mouth every 6 (six) hours as needed for severe pain. For AFTER surgery only, do not take and drive   Vitamin D 50 MCG (2000 UT) tablet Take 2,000 Units by mouth daily.       ASK your doctor about these medications    loratadine 10 MG tablet Commonly known as: CLARITIN Take 10 mg by mouth daily.        Follow-up Information     Lafonda Mosses, MD Follow up on 07/18/2020.   Specialty: Gynecologic Oncology Why: at 3:30pm will be a PHONE visit to discuss pathology. IN PERSON visit will be on 08/02/20 at 1:15pm at the [facility] information: [address] [place] [address] [phone]                 Greater than thirty minutes were spend for face to face discharge instructions and discharge orders/summary in EPIC.   Signed: Dorothyann Gibbs 07/12/2020, 9:22 AM

## 2020-07-12 NOTE — Telephone Encounter (Signed)
Ms Pam Smith states that she just got home from the hospital. She had a hour drive.  She is nauseous and light headed. She has not eaten or drunk much fluid today. She is not in pain.  Reviewed with Zoila Shutter Will send in # 5 ondansetron 8 mg tablets 1 po q 8 hrs prn nausea..  She is to call the office over the weekend if nausea increase and or she begins to vomit as her bowels may not have woken up from surgery.  Told her to stay with liquids  crackers this evening.  If feeling better in the am,she can progress her diet as tolerated. Patient and husband verbalized understanding.

## 2020-07-13 ENCOUNTER — Telehealth: Payer: Self-pay | Admitting: Gynecologic Oncology

## 2020-07-13 NOTE — Telephone Encounter (Addendum)
Patient called the after hours nurse line reporting she wasn't feeling well. I spoke with the nurse and gave recommendations. I called the patient shortly after. She endorses continued shaking, feeling jittery and nausea basically since surgery. Has only taken her allergy medication and tramadol since leaving the hospital yesterday. Hasn't been able to eat or drink much due to nausea (just was able to have a little chicken soup). Denies any abdominal pain. Denies feeing feverish. Is voiding. +flatus.  The patient describes not being sure she can tolerate another few hours like this. I recommended that she work on fluid intake and stop using Tramadol for any headache or jittery symptoms. I also recommended, given symptoms not improving, that she go to the ED for evaluation. I think that this can happen at a facility closer to home.  My suspicion is that symptoms are related to anesthesia. I think they have been compounded by pain medication that she has taken and poor PO intake. It is also possible that they are related to hyponatremia (patient was mildly hyponatremic on pre-op labs)  Patient and husband voiced understanding of our conversation and my recommendations. They will give my name to any provider they see this evening in an ED. I will call them tomorrow to check in.  Jeral Pinch MD Gynecologic Oncology

## 2020-07-15 ENCOUNTER — Telehealth: Payer: Self-pay | Admitting: *Deleted

## 2020-07-15 ENCOUNTER — Encounter (HOSPITAL_BASED_OUTPATIENT_CLINIC_OR_DEPARTMENT_OTHER): Payer: Self-pay | Admitting: Gynecologic Oncology

## 2020-07-15 ENCOUNTER — Other Ambulatory Visit: Payer: Self-pay

## 2020-07-15 NOTE — Telephone Encounter (Signed)
Patient called and stated "I'm sorry I missed the call yesterday from Dr Berline Lopes. Let her know the doctors here have got my electrolytes balanced out and better. They are talking about sending me home today." Message given to Dr Berline Lopes

## 2020-07-16 ENCOUNTER — Inpatient Hospital Stay: Payer: Medicare Other | Attending: Gynecologic Oncology | Admitting: Gynecologic Oncology

## 2020-07-16 ENCOUNTER — Encounter: Payer: Self-pay | Admitting: Gynecologic Oncology

## 2020-07-16 DIAGNOSIS — Z90710 Acquired absence of both cervix and uterus: Secondary | ICD-10-CM

## 2020-07-16 DIAGNOSIS — Z90722 Acquired absence of ovaries, bilateral: Secondary | ICD-10-CM | POA: Insufficient documentation

## 2020-07-16 DIAGNOSIS — C541 Malignant neoplasm of endometrium: Secondary | ICD-10-CM | POA: Insufficient documentation

## 2020-07-16 DIAGNOSIS — E871 Hypo-osmolality and hyponatremia: Secondary | ICD-10-CM | POA: Insufficient documentation
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
## 2020-07-17 LAB — SURGICAL PATHOLOGY

## 2020-07-18 ENCOUNTER — Ambulatory Visit: Payer: Medicare Other | Admitting: Gynecologic Oncology

## 2020-07-31 ENCOUNTER — Encounter: Payer: Self-pay | Admitting: Gynecologic Oncology

## 2020-08-02 ENCOUNTER — Inpatient Hospital Stay (HOSPITAL_BASED_OUTPATIENT_CLINIC_OR_DEPARTMENT_OTHER): Payer: Medicare Other | Admitting: Gynecologic Oncology

## 2020-08-02 ENCOUNTER — Other Ambulatory Visit: Payer: Self-pay

## 2020-08-02 ENCOUNTER — Encounter: Payer: Self-pay | Admitting: Gynecologic Oncology

## 2020-08-02 ENCOUNTER — Inpatient Hospital Stay: Payer: Medicare Other

## 2020-08-02 VITALS — BP 139/74 | HR 89 | Temp 97.8°F | Resp 18 | Ht 61.0 in | Wt 139.6 lb

## 2020-08-02 DIAGNOSIS — C541 Malignant neoplasm of endometrium: Secondary | ICD-10-CM

## 2020-08-02 DIAGNOSIS — Z90710 Acquired absence of both cervix and uterus: Secondary | ICD-10-CM | POA: Diagnosis not present

## 2020-08-02 DIAGNOSIS — E871 Hypo-osmolality and hyponatremia: Secondary | ICD-10-CM

## 2020-08-02 DIAGNOSIS — Z90722 Acquired absence of ovaries, bilateral: Secondary | ICD-10-CM

## 2020-08-02 DIAGNOSIS — Z7189 Other specified counseling: Secondary | ICD-10-CM

## 2020-08-02 LAB — BASIC METABOLIC PANEL - CANCER CENTER ONLY
Anion gap: 8 (ref 5–15)
BUN: 20 mg/dL (ref 8–23)
CO2: 25 mmol/L (ref 22–32)
Calcium: 9.4 mg/dL (ref 8.9–10.3)
Chloride: 110 mmol/L (ref 98–111)
Creatinine: 1.17 mg/dL — ABNORMAL HIGH (ref 0.44–1.00)
GFR, Estimated: 48 mL/min — ABNORMAL LOW (ref >60)
Glucose, Bld: 82 mg/dL (ref 70–99)
Potassium: 4.3 mmol/L (ref 3.5–5.1)
Sodium: 143 mmol/L (ref 135–145)

## 2020-08-02 NOTE — Patient Instructions (Signed)
It was good to see you today!  I will let you know how your blood work looks from today.  All of your incisions are healing well.  The stitches in your vagina will dissolve over the next 3-6 weeks.  It would be normal to have a little bit of spotting when this happens.  Remember no lifting more than 10 pounds until 6 weeks after surgery and nothing in the vagina including soaking in water until at least 8 weeks after surgery.  I will plan to see you in 6 months for your next checkup.  Please call back in December if you have not heard from [place] to get scheduled to see me in the end of January.  Our schedule is not out for 2023 yet.  If you develop any symptoms that would be concerning for cancer, such as vaginal bleeding, pelvic pain, change to bowel function, or unintentional weight loss, please call me sooner to come to clinic.  Im happy to put a referral in for you to see urology at [facility] here in town.  Please call the office to let me know if you would like me to place that referral.
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
## 2020-08-06 ENCOUNTER — Telehealth: Payer: Self-pay

## 2020-08-06 NOTE — Telephone Encounter (Signed)
Told Pam Smith that the intermittent twinges to the right of the belly button when she moves certain ways is part of the healing process.  She can call if the pain increases and is more persistent. Pt verbalized understanding.

## 2020-08-06 NOTE — Telephone Encounter (Signed)
Pam Smith stated that she had her repeat b-met set up for 08-21-20 with PCP.  She was feeling so much better Sunday after pushing fluids.  Told her that Dr. Berline Lopes would like to have the b-met rechecked this week.  She will call her PCP and see what she can do. Requested she call Dr. Charisse March office with new appointment date. Pt verbalized understanding.

## 2020-08-06 NOTE — Telephone Encounter (Signed)
Ms Cotrell is having labs done at Dr. Jerene Dilling office Thursday 08-08-20 at 2:40 pm.  Gave Ms Playford the fax number to have results sent to Dr. Berline Lopes. Fax: [phone].

## 2020-08-09 NOTE — Telephone Encounter (Signed)
Told Ms Knibbs that the Creatine is improving. It was 1.17 on 08-02-20 and now is 1.05 on 08-08-20. Dr. Berline Lopes is pleased that the creatine is improving. Dr. Berline Lopes wants her to continue with at least 64 ounces of water with some Gatorade mixed in. Pt verbalized understanding. Told Ms Greggory Stallion to ask her PCP when she needed a follow up chemistry panel. Pt verbalized understanding.

## 2020-08-12 ENCOUNTER — Telehealth: Payer: Self-pay

## 2020-08-12 NOTE — Telephone Encounter (Signed)
Received phone call from Pam Smith stating she is ready for the referral for urology.  Referral for [facility] has been sent via fax.

## 2020-08-15 ENCOUNTER — Other Ambulatory Visit: Payer: Self-pay | Admitting: Obstetrics & Gynecology

## 2020-08-15 ENCOUNTER — Telehealth: Payer: Self-pay

## 2020-08-15 DIAGNOSIS — Z1231 Encounter for screening mammogram for malignant neoplasm of breast: Secondary | ICD-10-CM

## 2020-08-15 NOTE — Telephone Encounter (Signed)
Received call from Ms. Pam Smith. Patient reports a change in bowels over the past week. Stools have become increasingly loose. Pt. Denies N/V,fever, pain or discomfort. Denies any laxative use. She does report an increase in water consumption and fresh vegetables. Patient states she will try eating more fiber.  Instructed to monitor her stools and if no changes with diet change to notify us. Patient verbalized understanding and is in agreement.

## 2020-08-26 ENCOUNTER — Encounter: Payer: Self-pay | Admitting: Gynecologic Oncology

## 2020-08-26 ENCOUNTER — Telehealth: Payer: Self-pay

## 2020-08-26 NOTE — Telephone Encounter (Signed)
Received call from Ms. Berman stating she has an inflamed belly button. She reports her umbilicus is red, itchy and has little white blisters. She has been putting neosporin on it with some relief but states it is irritated. Patient will upload pictures to her mychart so site can be visualized.  Pictures reviewed with Joylene John, NP. Instructed to wash umbilicus and pat dry. Discontinue neosporin and apply hydrocortisone cream to the affected area. If symptoms worsen or she is not getting relief with the hydrocortisone cream call our office Wednesday. Patient verbalized understanding.

## 2020-08-28 ENCOUNTER — Encounter: Payer: Self-pay | Admitting: Gynecologic Oncology

## 2020-09-25 ENCOUNTER — Telehealth: Payer: Self-pay | Admitting: *Deleted

## 2020-09-25 NOTE — Telephone Encounter (Signed)
Mailed forms to patient

## 2020-10-03 ENCOUNTER — Ambulatory Visit
Admission: RE | Admit: 2020-10-03 | Discharge: 2020-10-03 | Disposition: A | Discharge status: Home / Self Care | Payer: Medicare Other | Source: Ambulatory Visit | Attending: Obstetrics & Gynecology | Admitting: Obstetrics & Gynecology

## 2020-10-03 ENCOUNTER — Other Ambulatory Visit: Payer: Self-pay

## 2020-10-03 DIAGNOSIS — Z1231 Encounter for screening mammogram for malignant neoplasm of breast: Secondary | ICD-10-CM

## 2020-10-07 ENCOUNTER — Telehealth: Payer: Self-pay

## 2020-10-07 NOTE — Telephone Encounter (Signed)
Pam Abbasi states that she had some mild lower abdominal cramping today.  She went to urinate ant she noticed some light pink blood on the tissue which concerned her. She is 12 weeks post-op. She has been more active in the last week or so but is not lifting heavy objects. Reviewed with Dr. Berline Lopes.  Told Pam Smith that Dr. Berline Lopes said that if the spotting occurs again that she needs to call the office to be seen by [facility] or herself  to be evaluated. Pam Leota Jacobsen verbalized understanding.

## 2020-10-10 ENCOUNTER — Other Ambulatory Visit: Payer: Self-pay | Admitting: Obstetrics & Gynecology

## 2020-10-10 DIAGNOSIS — R928 Other abnormal and inconclusive findings on diagnostic imaging of breast: Secondary | ICD-10-CM

## 2020-10-11 ENCOUNTER — Telehealth: Payer: Self-pay | Admitting: *Deleted

## 2020-10-11 NOTE — Telephone Encounter (Signed)
Patient called and stated "I called earlier this week about some spotting I was having, and was told to call back if it happened again. I had some spotting yesterday just when I wipe. I did attempt to lift a box then decide I should and put it back down. I just wanted to let Dr Berline Lopes know about the spotting. I am in no pain/cramping. It only happened once."

## 2020-10-11 NOTE — Telephone Encounter (Addendum)
Following up with Pam Smith [id number] bleeding. Per Joylene John, NP continue to monitor over the weekend and follow up with our office Monday morning. Instructed patient to call if bleeding increases or she has questions or concerns. Provided patient with after hours number. Patient verbalized understanding.

## 2020-10-13 NOTE — Telephone Encounter (Signed)
Please call the patient and schedule her to see me on either Thursday or Friday (I can see her Thursday between OR cases). Thanks!

## 2020-10-14 ENCOUNTER — Telehealth: Payer: Self-pay

## 2020-10-14 NOTE — Telephone Encounter (Signed)
Spoke with Pam Smith Re: Vaginal spotting. Patient states she had some spotting on Friday but it has stopped over the weekend. She reports having cramping in her lower abdomen "like period cramps", that are mostly constant. She rates discomfort as a 7/10 with an occasional dull headache. She has not taken tylenol, advil or used a heating pad. MD notified. Scheduled appointment with Pam Smith 10/18/20 at 3:45pm. Patient is in agreement of date and time of appointment. Patient states she is going to take tylenol and see if that helps. Instructed to call our office if her symptoms are worsening or if she has questions or concerns. Patient verbalized understanding.
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
## 2020-10-18 ENCOUNTER — Inpatient Hospital Stay: Payer: Medicare Other | Attending: Gynecologic Oncology | Admitting: Gynecologic Oncology

## 2020-10-18 ENCOUNTER — Other Ambulatory Visit: Payer: Self-pay

## 2020-10-18 ENCOUNTER — Encounter: Payer: Self-pay | Admitting: Gynecologic Oncology

## 2020-10-18 VITALS — BP 143/75 | HR 84 | Temp 97.8°F | Resp 16 | Ht 61.0 in | Wt 144.5 lb

## 2020-10-18 DIAGNOSIS — Z90710 Acquired absence of both cervix and uterus: Secondary | ICD-10-CM | POA: Insufficient documentation

## 2020-10-18 DIAGNOSIS — Z90722 Acquired absence of ovaries, bilateral: Secondary | ICD-10-CM | POA: Insufficient documentation

## 2020-10-18 DIAGNOSIS — C541 Malignant neoplasm of endometrium: Secondary | ICD-10-CM | POA: Insufficient documentation

## 2020-10-18 DIAGNOSIS — N939 Abnormal uterine and vaginal bleeding, unspecified: Secondary | ICD-10-CM | POA: Diagnosis present

## 2020-10-18 NOTE — Patient Instructions (Signed)
I will call you with biopsy results once back next week. You may have a little spotting for the next couple of days after the biopsy.

## 2020-10-21 ENCOUNTER — Telehealth: Payer: Self-pay | Admitting: Gynecologic Oncology

## 2020-10-21 DIAGNOSIS — N952 Postmenopausal atrophic vaginitis: Secondary | ICD-10-CM

## 2020-10-21 LAB — SURGICAL PATHOLOGY

## 2020-10-21 MED ORDER — ESTROGENS CONJUGATED 0.625 MG/GM VA CREA: 1.0000 | TOPICAL_CREAM | VAGINAL | 1 refills | Status: DC

## 2020-10-21 NOTE — Telephone Encounter (Signed)
Called the patient to discuss recent biopsy, which showed fibrosis consistent with scar tissue.  No evidence of malignancy.  She is very happy with this news.  Given appearance of the apex of the vagina, I have recommended that we try some vaginal estrogen cream to see if this helps with healing.  I will see her back in about 6-8 weeks for repeat exam.  Jeral Pinch MD Gynecologic Oncology

## 2020-10-23 ENCOUNTER — Telehealth: Payer: Self-pay | Admitting: *Deleted

## 2020-10-23 NOTE — Telephone Encounter (Signed)
Called and left the patient a message to call the office back. Patient needs an 8 week follow up with Dr Berline Lopes

## 2020-10-23 NOTE — Telephone Encounter (Signed)
Patient called back and was scheduled for an 8 week appt

## 2020-10-28 ENCOUNTER — Other Ambulatory Visit: Payer: Medicare Other

## 2020-11-07 ENCOUNTER — Other Ambulatory Visit: Payer: Self-pay

## 2020-11-07 ENCOUNTER — Ambulatory Visit: Payer: Medicare Other

## 2020-11-07 ENCOUNTER — Ambulatory Visit
Admission: RE | Admit: 2020-11-07 | Discharge: 2020-11-07 | Disposition: A | Discharge status: Home / Self Care | Payer: Medicare Other | Source: Ambulatory Visit | Attending: Obstetrics & Gynecology | Admitting: Obstetrics & Gynecology

## 2020-11-07 DIAGNOSIS — R928 Other abnormal and inconclusive findings on diagnostic imaging of breast: Secondary | ICD-10-CM

## 2020-11-11 ENCOUNTER — Telehealth: Payer: Self-pay

## 2020-11-11 NOTE — Telephone Encounter (Signed)
ENCOUNTER OPENED IN ERROR

## 2020-11-14 ENCOUNTER — Other Ambulatory Visit: Payer: Medicare Other

## 2020-11-20 ENCOUNTER — Encounter: Payer: Self-pay | Admitting: Gynecologic Oncology

## 2020-11-25 ENCOUNTER — Inpatient Hospital Stay: Payer: Medicare Other | Attending: Gynecologic Oncology | Admitting: Gynecologic Oncology

## 2020-11-25 ENCOUNTER — Encounter: Payer: Self-pay | Admitting: Gynecologic Oncology

## 2020-11-25 ENCOUNTER — Other Ambulatory Visit: Payer: Self-pay

## 2020-11-25 VITALS — BP 168/86 | HR 95 | Temp 98.3°F | Resp 18 | Ht 61.02 in | Wt 144.0 lb

## 2020-11-25 DIAGNOSIS — N952 Postmenopausal atrophic vaginitis: Secondary | ICD-10-CM | POA: Diagnosis not present

## 2020-11-25 DIAGNOSIS — N183 Chronic kidney disease, stage 3 unspecified: Secondary | ICD-10-CM | POA: Diagnosis not present

## 2020-11-25 DIAGNOSIS — M810 Age-related osteoporosis without current pathological fracture: Secondary | ICD-10-CM | POA: Diagnosis not present

## 2020-11-25 DIAGNOSIS — C541 Malignant neoplasm of endometrium: Secondary | ICD-10-CM | POA: Insufficient documentation

## 2020-11-25 DIAGNOSIS — Z90722 Acquired absence of ovaries, bilateral: Secondary | ICD-10-CM | POA: Insufficient documentation

## 2020-11-25 DIAGNOSIS — Z8616 Personal history of COVID-19: Secondary | ICD-10-CM | POA: Diagnosis not present

## 2020-11-25 DIAGNOSIS — Z90710 Acquired absence of both cervix and uterus: Secondary | ICD-10-CM | POA: Insufficient documentation

## 2020-11-25 DIAGNOSIS — M1990 Unspecified osteoarthritis, unspecified site: Secondary | ICD-10-CM | POA: Diagnosis not present

## 2020-11-25 NOTE — Patient Instructions (Addendum)
It was good to see you today!  I will see you in 3 months for an exam.  As always, if you develop any new or concerning symptoms between now and your next visit with me, please call to see me sooner.
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
## 2020-12-09 ENCOUNTER — Ambulatory Visit: Payer: Medicare Other | Admitting: Gynecologic Oncology

## 2021-01-05 DIAGNOSIS — C4490 Unspecified malignant neoplasm of skin, unspecified: Secondary | ICD-10-CM

## 2021-01-05 HISTORY — PX: SKIN CANCER EXCISION: SHX779

## 2021-01-05 HISTORY — DX: Unspecified malignant neoplasm of skin, unspecified: C44.90

## 2021-02-21 ENCOUNTER — Encounter: Payer: Self-pay | Admitting: Gynecologic Oncology

## 2021-02-24 ENCOUNTER — Encounter: Payer: Self-pay | Admitting: Gynecologic Oncology

## 2021-02-24 ENCOUNTER — Inpatient Hospital Stay: Payer: Medicare Other | Attending: Gynecologic Oncology | Admitting: Gynecologic Oncology

## 2021-02-24 ENCOUNTER — Other Ambulatory Visit: Payer: Self-pay

## 2021-02-24 VITALS — BP 136/67 | HR 89 | Temp 97.9°F | Resp 14 | Ht 62.6 in | Wt 145.2 lb

## 2021-02-24 DIAGNOSIS — Z90710 Acquired absence of both cervix and uterus: Secondary | ICD-10-CM | POA: Diagnosis not present

## 2021-02-24 DIAGNOSIS — N183 Chronic kidney disease, stage 3 unspecified: Secondary | ICD-10-CM | POA: Diagnosis not present

## 2021-02-24 DIAGNOSIS — C541 Malignant neoplasm of endometrium: Secondary | ICD-10-CM

## 2021-02-24 DIAGNOSIS — N992 Postprocedural adhesions of vagina: Secondary | ICD-10-CM | POA: Insufficient documentation

## 2021-02-24 DIAGNOSIS — M810 Age-related osteoporosis without current pathological fracture: Secondary | ICD-10-CM | POA: Diagnosis not present

## 2021-02-24 DIAGNOSIS — N952 Postmenopausal atrophic vaginitis: Secondary | ICD-10-CM

## 2021-02-24 DIAGNOSIS — Z8542 Personal history of malignant neoplasm of other parts of uterus: Secondary | ICD-10-CM | POA: Insufficient documentation

## 2021-02-24 DIAGNOSIS — Z90722 Acquired absence of ovaries, bilateral: Secondary | ICD-10-CM | POA: Diagnosis not present

## 2021-02-24 DIAGNOSIS — M1990 Unspecified osteoarthritis, unspecified site: Secondary | ICD-10-CM | POA: Insufficient documentation
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
## 2021-02-24 NOTE — Patient Instructions (Addendum)
It was good to see you today.  I do not see or feel any evidence of cancer recurrence.  I will see you back for follow-up in 3 months more time.  If everything looks stable at that visit, then we will space visits out to every 6 months alternating between my office and your OB/GYN.  If you develop any new or concerning symptoms, such as vaginal bleeding, pain, or unintentional weight loss, please call to see me sooner.

## 2021-04-30 ENCOUNTER — Ambulatory Visit: Payer: Medicare Other | Admitting: Obstetrics & Gynecology

## 2021-05-27 ENCOUNTER — Encounter: Payer: Self-pay | Admitting: Gynecologic Oncology

## 2021-05-30 ENCOUNTER — Other Ambulatory Visit: Payer: Self-pay

## 2021-05-30 ENCOUNTER — Inpatient Hospital Stay: Payer: Medicare Other | Attending: Gynecologic Oncology | Admitting: Gynecologic Oncology

## 2021-05-30 ENCOUNTER — Encounter: Payer: Self-pay | Admitting: Gynecologic Oncology

## 2021-05-30 VITALS — BP 144/76 | HR 84 | Temp 97.8°F | Resp 16 | Ht 62.0 in | Wt 147.0 lb

## 2021-05-30 DIAGNOSIS — C541 Malignant neoplasm of endometrium: Secondary | ICD-10-CM

## 2021-05-30 DIAGNOSIS — Z90710 Acquired absence of both cervix and uterus: Secondary | ICD-10-CM | POA: Diagnosis not present

## 2021-05-30 DIAGNOSIS — N952 Postmenopausal atrophic vaginitis: Secondary | ICD-10-CM | POA: Diagnosis not present

## 2021-05-30 DIAGNOSIS — Z90722 Acquired absence of ovaries, bilateral: Secondary | ICD-10-CM | POA: Insufficient documentation

## 2021-05-30 DIAGNOSIS — Z8542 Personal history of malignant neoplasm of other parts of uterus: Secondary | ICD-10-CM | POA: Diagnosis not present

## 2021-05-30 NOTE — Patient Instructions (Addendum)
It was good to see you today.  I do not see or feel any evidence of cancer recurrence on your exam.  We will plan to transition to visits every 6 months now.  Please reach out to your OB/GYN (Dr. Dellis Filbert) for a visit in 6 months.  I will see you back around this time next year.  Please call sometime after the new year to get that visit scheduled.  As always, if you have any new and concerning symptoms, such as vaginal bleeding, pelvic pain, change to bowel function, or unintentional weight loss, please call to see me sooner.
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
## 2021-07-16 ENCOUNTER — Ambulatory Visit: Payer: Medicare Other | Admitting: Obstetrics & Gynecology

## 2021-09-09 ENCOUNTER — Telehealth: Payer: Self-pay

## 2021-09-09 DIAGNOSIS — N952 Postmenopausal atrophic vaginitis: Secondary | ICD-10-CM

## 2021-09-09 MED ORDER — ESTROGENS CONJUGATED 0.625 MG/GM VA CREA: 1.0000 | TOPICAL_CREAM | VAGINAL | 3 refills | Status: DC

## 2021-09-09 NOTE — Telephone Encounter (Signed)
Prescription refill request received from [facility] for pt's Premarin vaginal cream.   I spoke to pt to make sure she needs a refill or if the request was automated generated. She states she does need a refill. She typically uses it 2x a week. She would like 2 refills.   Dr.Tucker/Melissa Cross NP notified.

## 2021-09-09 NOTE — Telephone Encounter (Signed)
Pt is aware Premarin cream has been refilled and sent to her pharmacy

## 2021-10-20 ENCOUNTER — Ambulatory Visit: Payer: Medicare Other | Admitting: Obstetrics & Gynecology

## 2021-10-20 ENCOUNTER — Telehealth: Payer: Self-pay

## 2021-10-20 ENCOUNTER — Ambulatory Visit (INDEPENDENT_AMBULATORY_CARE_PROVIDER_SITE_OTHER): Payer: Medicare Other | Admitting: Obstetrics & Gynecology

## 2021-10-20 ENCOUNTER — Encounter: Payer: Self-pay | Admitting: Obstetrics & Gynecology

## 2021-10-20 VITALS — BP 128/80

## 2021-10-20 DIAGNOSIS — N6320 Unspecified lump in the left breast, unspecified quadrant: Secondary | ICD-10-CM

## 2021-10-20 DIAGNOSIS — N6310 Unspecified lump in the right breast, unspecified quadrant: Secondary | ICD-10-CM

## 2021-10-20 NOTE — Progress Notes (Signed)
    Pam Smith [birth year] [id number]        81 y.o.  G1P1001   RP: Right breast lump  HPI: Patient noticed a bruise at her Rt upper outer breast and felt a small lump at that level.  She doesn't remember hitting her breast.  Patient's breasts are very dense and lumpy.  H/O a Lt breast Benign Bx.  Screening Mammo 09/2020 Lt Neg, Rt possible asymmetry. Category C.  Rt Dx mammo/US 11/2020 Benign.   OB History  Gravida Para Term Preterm AB Living  '1 1 1     1  '$ SAB IAB Ectopic Multiple Live Births               # Outcome Date GA Lbr Len/2nd Weight Sex Delivery Anes PTL Lv  1 Term             Past medical history,surgical history, problem list, medications, allergies, family history and social history were all reviewed and documented in the EPIC chart.   Directed ROS with pertinent positives and negatives documented in the history of present illness/assessment and plan.  Exam:  Vitals:   10/20/21 1540  BP: 128/80   General appearance:  Normal  Breast exam:  Left breast:  Large mass/increase density at the mid outer quadrant, around the site of previous Bx (scar tissue?)                        Right breast: Healing ecchymosis at the Rt outer upper breast about 3 x 3 cm.  Small nodule about 1 cm felt at that level, non-tender. No axillary LN felt bilaterally.   Assessment/Plan:  81 y.o. G1P1001   1. Bilateral breast lump Patient noticed a bruise at her Rt upper outer breast and felt a small lump at that level.  She doesn't remember hitting her breast.  Patient's breasts are very dense and lumpy.  H/O a Lt breast Benign Bx.  Screening Mammo 09/2020 Lt Neg, Rt possible asymmetry. Category C.  Rt Dx mammo/US 11/2020 Benign.  Breast exam: Left breast:  Large mass/increase density at the mid outer quadrant, around the site of previous Bx (scar tissue?).  Right breast: Healing ecchymosis at the Rt outer upper breast about 3 x 3 cm.  Small nodule about 1 cm felt at that level,  non-tender. No axillary LN felt bilaterally.  Will order Bilateral Dx Mammo with Breast US at the [facility].  Patient voiced understanding and agreement.  Other orders - rosuvastatin (CRESTOR) 10 MG tablet; Take 1 tablet every day by oral route. - TURMERIC PO; Take by mouth.   Princess Bruins MD, 3:47 PM 10/20/2021

## 2021-10-20 NOTE — Telephone Encounter (Signed)
Patient called requesting diagnostic breast imaging order be sent. Upon discussion she discovered a breast lump last night.  OV scheduled for 3:30pm today for breast exam.

## 2021-10-21 ENCOUNTER — Encounter: Payer: Self-pay | Admitting: Obstetrics & Gynecology

## 2021-10-21 ENCOUNTER — Telehealth: Payer: Self-pay

## 2021-10-21 DIAGNOSIS — N6311 Unspecified lump in the right breast, upper outer quadrant: Secondary | ICD-10-CM

## 2021-10-21 DIAGNOSIS — N6325 Unspecified lump in the left breast, overlapping quadrants: Secondary | ICD-10-CM

## 2021-10-21 NOTE — Telephone Encounter (Signed)
FYI. Scheduled for 12/09/2021 @ 2:20pm. LDVMOM per DPR.  Made sure that pt was aware that she could call as often as daily to check for cancellations to see if she could have her imaging done sooner.

## 2021-10-21 NOTE — Telephone Encounter (Signed)
-----   Message from Princess Bruins, MD sent at 10/20/2021  4:07 PM EDT ----- Regarding: Refer to the [facility] for Bilateral Dx mammo Bilateral lumpy dense breasts.  Rt breast with a bruise with a small nodule 1 cm at the outer upper quadrant.  Lt breast with a large mass/dense area at the mid outer quadrant.   Schedule Bilateral Dx mammo/US.

## 2021-12-09 ENCOUNTER — Ambulatory Visit
Admission: RE | Admit: 2021-12-09 | Discharge: 2021-12-09 | Disposition: A | Discharge status: Home / Self Care | Payer: Medicare Other | Source: Ambulatory Visit | Attending: Obstetrics & Gynecology | Admitting: Obstetrics & Gynecology

## 2021-12-09 ENCOUNTER — Ambulatory Visit: Admission: RE | Admit: 2021-12-09 | Payer: Medicare Other | Source: Ambulatory Visit

## 2021-12-09 ENCOUNTER — Ambulatory Visit: Payer: Medicare Other

## 2021-12-09 DIAGNOSIS — N6325 Unspecified lump in the left breast, overlapping quadrants: Secondary | ICD-10-CM

## 2021-12-09 DIAGNOSIS — N6311 Unspecified lump in the right breast, upper outer quadrant: Secondary | ICD-10-CM

## 2021-12-17 ENCOUNTER — Encounter: Payer: Self-pay | Admitting: Obstetrics & Gynecology

## 2021-12-17 ENCOUNTER — Ambulatory Visit (INDEPENDENT_AMBULATORY_CARE_PROVIDER_SITE_OTHER): Payer: Medicare Other | Admitting: Obstetrics & Gynecology

## 2021-12-17 ENCOUNTER — Other Ambulatory Visit (HOSPITAL_COMMUNITY)
Admission: RE | Admit: 2021-12-17 | Discharge: 2021-12-17 | Disposition: A | Discharge status: Home / Self Care | Payer: Medicare Other | Source: Ambulatory Visit | Attending: Obstetrics & Gynecology | Admitting: Obstetrics & Gynecology

## 2021-12-17 VITALS — BP 110/74 | HR 88 | Ht 60.75 in | Wt 143.0 lb

## 2021-12-17 DIAGNOSIS — Z780 Asymptomatic menopausal state: Secondary | ICD-10-CM | POA: Diagnosis not present

## 2021-12-17 DIAGNOSIS — C541 Malignant neoplasm of endometrium: Secondary | ICD-10-CM

## 2021-12-17 DIAGNOSIS — Z9079 Acquired absence of other genital organ(s): Secondary | ICD-10-CM

## 2021-12-17 DIAGNOSIS — M810 Age-related osteoporosis without current pathological fracture: Secondary | ICD-10-CM

## 2021-12-17 DIAGNOSIS — Z90710 Acquired absence of both cervix and uterus: Secondary | ICD-10-CM

## 2021-12-17 DIAGNOSIS — Z9189 Other specified personal risk factors, not elsewhere classified: Secondary | ICD-10-CM

## 2021-12-17 DIAGNOSIS — Z90722 Acquired absence of ovaries, bilateral: Secondary | ICD-10-CM

## 2021-12-17 DIAGNOSIS — B009 Herpesviral infection, unspecified: Secondary | ICD-10-CM

## 2021-12-17 DIAGNOSIS — Z9289 Personal history of other medical treatment: Secondary | ICD-10-CM

## 2021-12-17 DIAGNOSIS — Z1272 Encounter for screening for malignant neoplasm of vagina: Secondary | ICD-10-CM

## 2021-12-17 DIAGNOSIS — Z01419 Encounter for gynecological examination (general) (routine) without abnormal findings: Secondary | ICD-10-CM | POA: Insufficient documentation

## 2021-12-17 NOTE — Progress Notes (Signed)
Pam Smith [birth year] [id number]   History:    81 y.o. G1P1L1 Married  RP: Established patient presenting for Annual Gyn exam    HPI: Robotic-assisted laparoscopic total hysterectomy with bilateral salpingo-oophorectomy, SLN biopsy on 07/11/2020, patho: Endometrioid adenocarcinoma, FIGO grade I with no myometrial invasion. Patho cervix benign 07/11/2020. Pap reflex at the vaginal vault today.  Breasts normal.  MMG Benign on 12-09-21.  BMD Osteoporosis 11-21-21.  Stopped Fosamax.  Will start Prolia through her Fam MD. COLONOSCOPY: 2020.  Flu vaccine done at pharmacy.   Past medical history,surgical history, family history and social history were all reviewed and documented in the EPIC chart.  Gynecologic History No LMP recorded. Patient is postmenopausal.  Obstetric History OB History  Gravida Para Term Preterm AB Living  '1 1 1     1  '$ SAB IAB Ectopic Multiple Live Births               # Outcome Date GA Lbr Len/2nd Weight Sex Delivery Anes PTL Lv  1 Term              ROS: A ROS was performed and pertinent positives and negatives are included in the history. GENERAL: No fevers or chills. HEENT: No change in vision, no earache, sore throat or sinus congestion. NECK: No pain or stiffness. CARDIOVASCULAR: No chest pain or pressure. No palpitations. PULMONARY: No shortness of breath, cough or wheeze. GASTROINTESTINAL: No abdominal pain, nausea, vomiting or diarrhea, melena or bright red blood per rectum. GENITOURINARY: No urinary frequency, urgency, hesitancy or dysuria. MUSCULOSKELETAL: No joint or muscle pain, no back pain, no recent trauma. DERMATOLOGIC: No rash, no itching, no lesions. ENDOCRINE: No polyuria, polydipsia, no heat or cold intolerance. No recent change in weight. HEMATOLOGICAL: No anemia or easy bruising or bleeding. NEUROLOGIC: No headache, seizures, numbness, tingling or weakness. PSYCHIATRIC: No depression, no loss of interest in normal activity or change in sleep pattern.      Exam:   BP 110/74   Pulse 88   Ht 5' 0.75" (1.543 m)   Wt 143 lb (64.9 kg)   SpO2 97%   BMI 27.24 kg/m   Body mass index is 27.24 kg/m.  General appearance : Well developed well nourished female. No acute distress HEENT: Eyes: no retinal hemorrhage or exudates,  Neck supple, trachea midline, no carotid bruits, no thyroidmegaly Lungs: Clear to auscultation, no rhonchi or wheezes, or rib retractions  Heart: Regular rate and rhythm, no murmurs or gallops Breast:Examined in sitting and supine position were symmetrical in appearance, no palpable masses or tenderness,  no skin retraction, no nipple inversion, no nipple discharge, no skin discoloration, no axillary or supraclavicular lymphadenopathy Abdomen: no palpable masses or tenderness, no rebound or guarding Extremities: no edema or skin discoloration or tenderness  Pelvic: Vulva: Normal             Vagina: No gross lesions or discharge.  Pap reflex done.  Cervix/Uterus absent  Adnexa  Without masses or tenderness  Anus: Normal   Assessment/Plan:  81 y.o. female for annual exam   1. Encounter for Papanicolaou smear of vagina as part of routine gynecological examination Robotic-assisted laparoscopic total hysterectomy with bilateral salpingo-oophorectomy, SLN biopsy on 07/11/2020, patho: Endometrioid adenocarcinoma, FIGO grade I with no myometrial invasion. Patho cervix benign 07/11/2020. Pap reflex at the vaginal vault today.  Breasts normal.  MMG Benign on 12-09-21.  BMD Osteoporosis 11-21-21.  Stopped Fosamax.  Will start Prolia through her Fam MD. COLONOSCOPY: 2020.  Flu  vaccine done at pharmacy.  - Cytology - PAP( [place])  2. 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  3. S/P total hysterectomy and bilateral salpingo-oophorectomy  4. Postmenopause Postmenopause, well on no HRT.  5. Age-related osteoporosis without current pathological fracture Will start on Prolia with her Fam  MD.  6. Personal history of other medical treatment  Other orders - amoxicillin (AMOXIL) 500 MG capsule; Take 500 mg by mouth 3 (three) times daily. For 7 days, had tooth pulled   Princess Bruins MD, 2:53 PM

## 2021-12-25 LAB — CYTOLOGY - PAP: Diagnosis: NEGATIVE

## 2022-01-16 ENCOUNTER — Other Ambulatory Visit: Payer: Self-pay | Admitting: Obstetrics & Gynecology

## 2022-01-16 ENCOUNTER — Encounter: Payer: Self-pay | Admitting: Obstetrics & Gynecology

## 2022-01-16 ENCOUNTER — Ambulatory Visit (INDEPENDENT_AMBULATORY_CARE_PROVIDER_SITE_OTHER): Payer: Medicare Other | Admitting: Obstetrics & Gynecology

## 2022-01-16 ENCOUNTER — Telehealth: Payer: Self-pay

## 2022-01-16 VITALS — BP 120/80 | HR 84

## 2022-01-16 DIAGNOSIS — N61 Mastitis without abscess: Secondary | ICD-10-CM | POA: Diagnosis not present

## 2022-01-16 MED ORDER — CEPHALEXIN 500 MG PO CAPS: 500.0000 mg | ORAL_CAPSULE | Freq: Two times a day (BID) | ORAL | 0 refills | Status: AC

## 2022-01-16 NOTE — Progress Notes (Signed)
    Pam Smith [birth year] [id number]        81 y.o.  G1P1001   RP: Left nipple redness and tenderness with retraction x 5 days  HPI: Left nipple redness and tenderness with retraction x 5 days.  No discharge seen at the nipple. H/O benign Bx many yrs ago at that location.  Bilateral Dx mammo 12/09/21 for Rt breast lump felt by patient, Rt Dx mammo/US benign, Lt negative.   OB History  Gravida Para Term Preterm AB Living  '1 1 1     1  '$ SAB IAB Ectopic Multiple Live Births               # Outcome Date GA Lbr Len/2nd Weight Sex Delivery Anes PTL Lv  1 Term             Past medical history,surgical history, problem list, medications, allergies, family history and social history were all reviewed and documented in the EPIC chart.   Directed ROS with pertinent positives and negatives documented in the history of present illness/assessment and plan.  Exam:  Vitals:   01/16/22 1038  BP: 120/80  Pulse: 84  SpO2: 98%   General appearance:  Normal  Breast Exam:  Rt breast and Rt axilla normal                         Lt breast:  Small tender abscess draining pus with pressure at the nipple which is retracted and erythematous.  Lt axilla normal.   Assessment/Plan:  83 y.o. G1P1001   1. Infection of left breast Left nipple abscess, draining.  Will treat with Keflex 500 mg BID x 7 days, prescription sent to pharmacy.  Warm compresses.  Left Dx mammo/US to complete the investigation.  Other orders - cephALEXin (KEFLEX) 500 MG capsule; Take 1 capsule (500 mg total) by mouth 2 (two) times daily for 7 days.   Princess Bruins MD, 10:42 AM 01/16/2022

## 2022-01-16 NOTE — Telephone Encounter (Signed)
-----  Message from Princess Bruins, MD sent at 01/16/2022 11:00 AM EST ----- Regarding: Order Left Dx mammo/Breast US Left breast pain, redness, retraction x 5 days.  On Breast exam: Pus draining at left nipple with pressure, abscess with retraction of nipple.  H/O benign breast biopsy at that location many years ago.

## 2022-01-16 NOTE — Telephone Encounter (Signed)
[place] @ [facility] has placed her 01/27/22 @ 245.  Left detailed msg per DPR w/ appt info and advised pt can call on a daily basis to check for cancellations.

## 2022-01-27 ENCOUNTER — Ambulatory Visit: Payer: Medicare Other

## 2022-01-27 ENCOUNTER — Ambulatory Visit
Admission: RE | Admit: 2022-01-27 | Discharge: 2022-01-27 | Disposition: A | Discharge status: Home / Self Care | Payer: Medicare Other | Source: Ambulatory Visit | Attending: Obstetrics & Gynecology | Admitting: Obstetrics & Gynecology

## 2022-01-27 DIAGNOSIS — N61 Mastitis without abscess: Secondary | ICD-10-CM

## 2022-02-28 ENCOUNTER — Ambulatory Visit: Payer: Medicare Other | Admitting: Gynecologic Oncology

## 2022-03-25 ENCOUNTER — Encounter: Payer: Self-pay | Admitting: "Endocrinology

## 2022-04-06 ENCOUNTER — Encounter: Payer: Self-pay | Admitting: "Endocrinology

## 2022-04-06 IMAGING — MG MM DIGITAL SCREENING BILAT W/ TOMO AND CAD
8 series · 8 of 24 positions shown · non-contrast
Comparison: Previous exam(s).

CLINICAL DATA: Screening.

EXAM:
DIGITAL SCREENING BILATERAL MAMMOGRAM WITH TOMOSYNTHESIS AND CAD
TECHNIQUE: Bilateral screening digital craniocaudal and mediolateral oblique
mammograms were obtained. Bilateral screening digital breast
tomosynthesis was performed. The images were evaluated with
computer-aided detection.

[R MLO synth-2D]
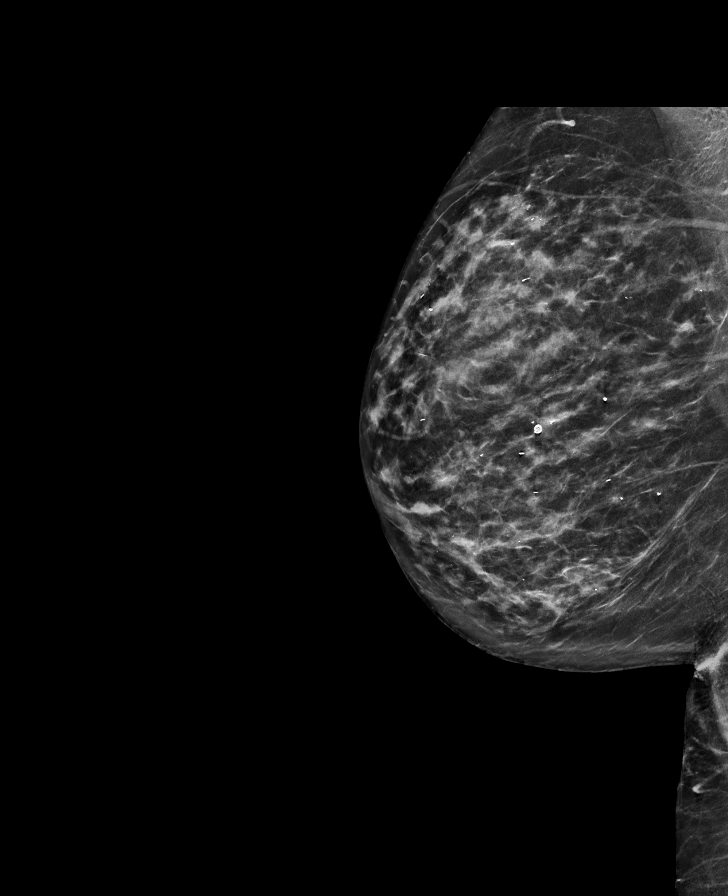

[L CC synth-2D]
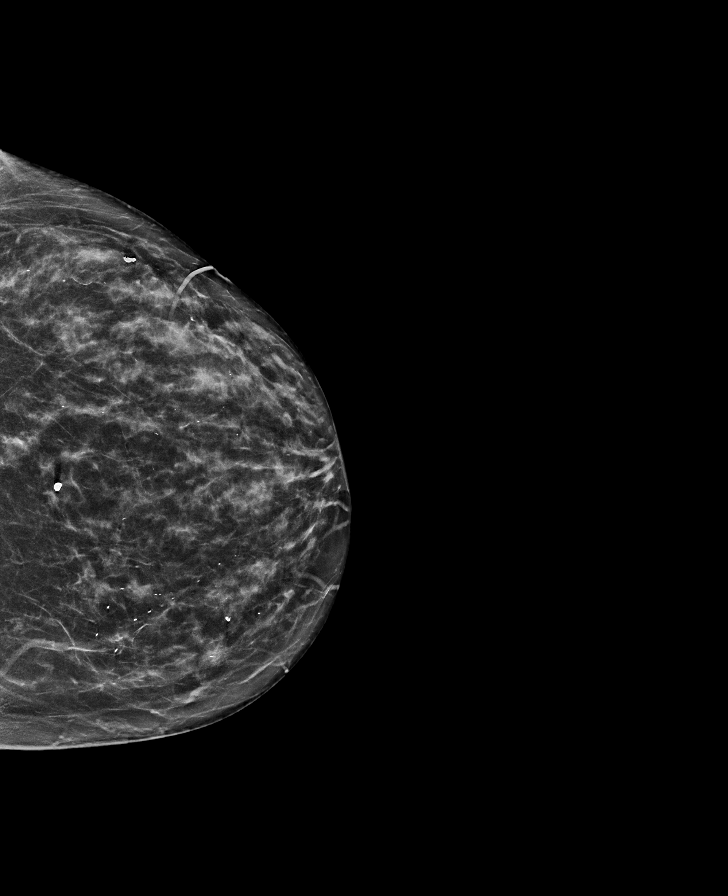

[L MLO synth-2D]
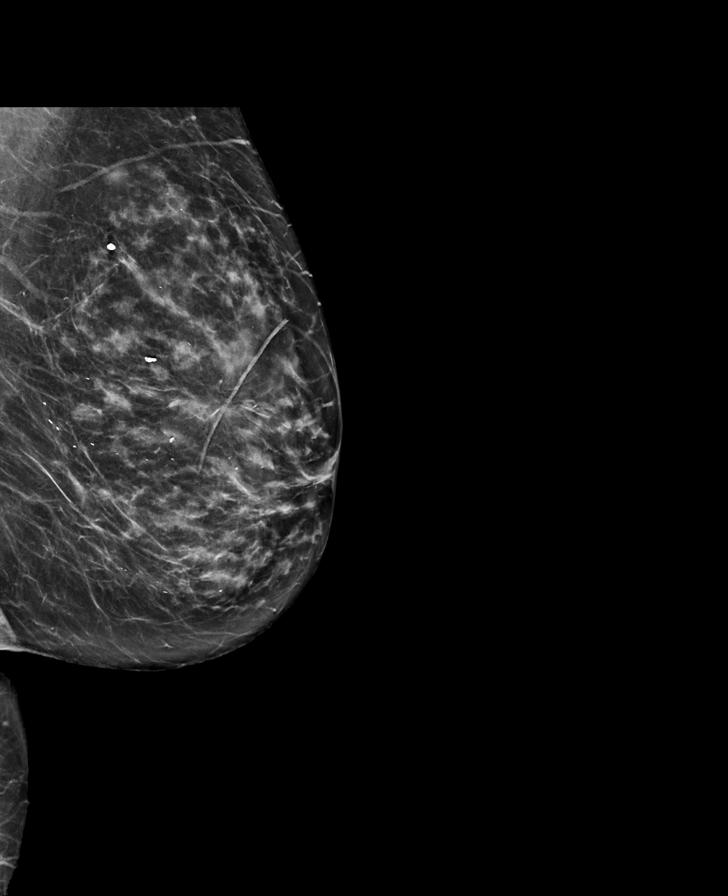

[R CC synth-2D]
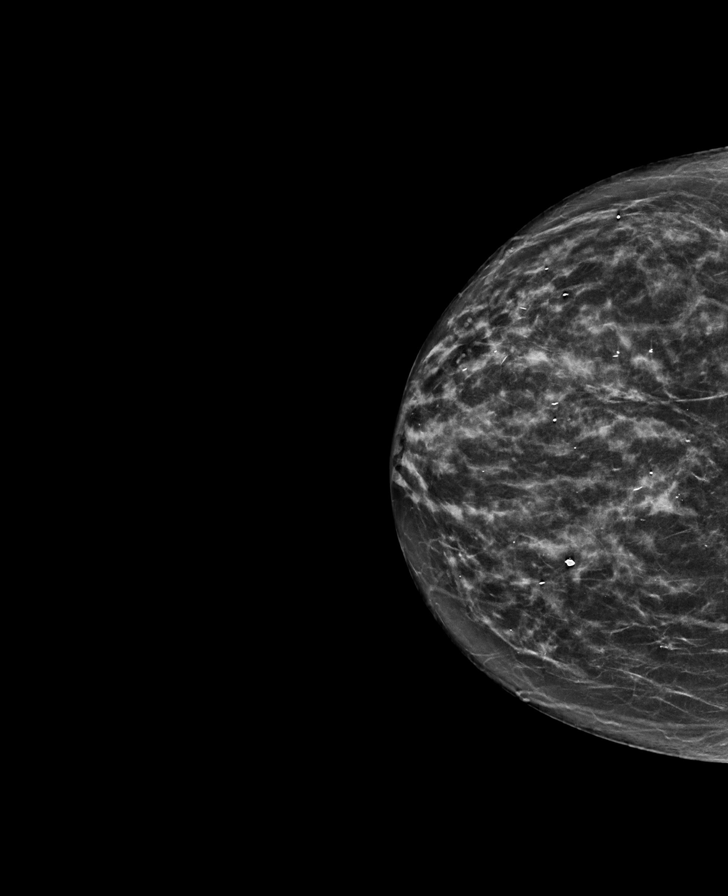

[R MLO tomo · tomo slice 33/66.0]
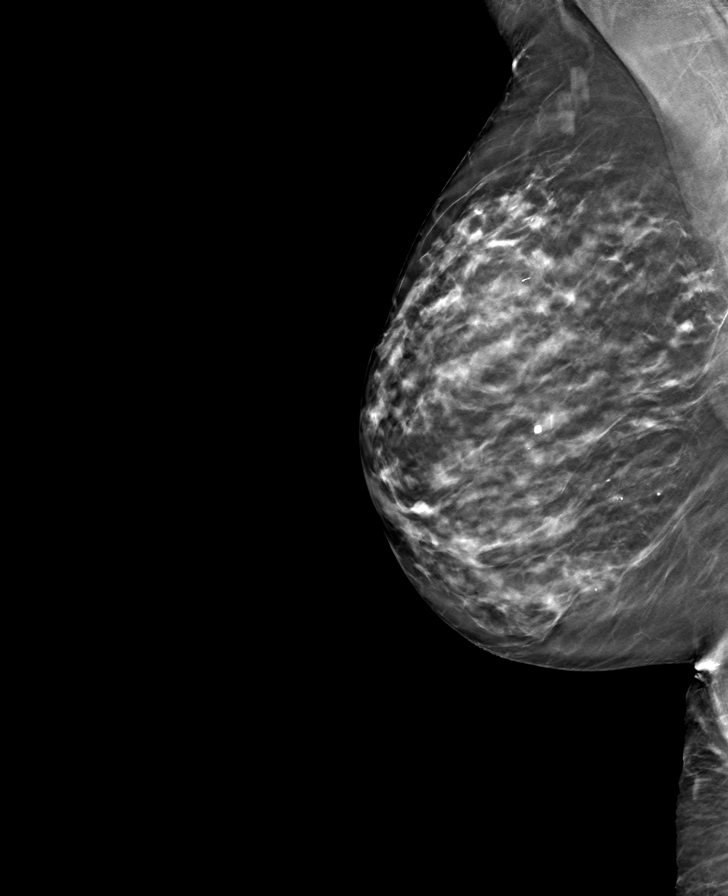

[L MLO tomo · tomo slice 35/68.0]
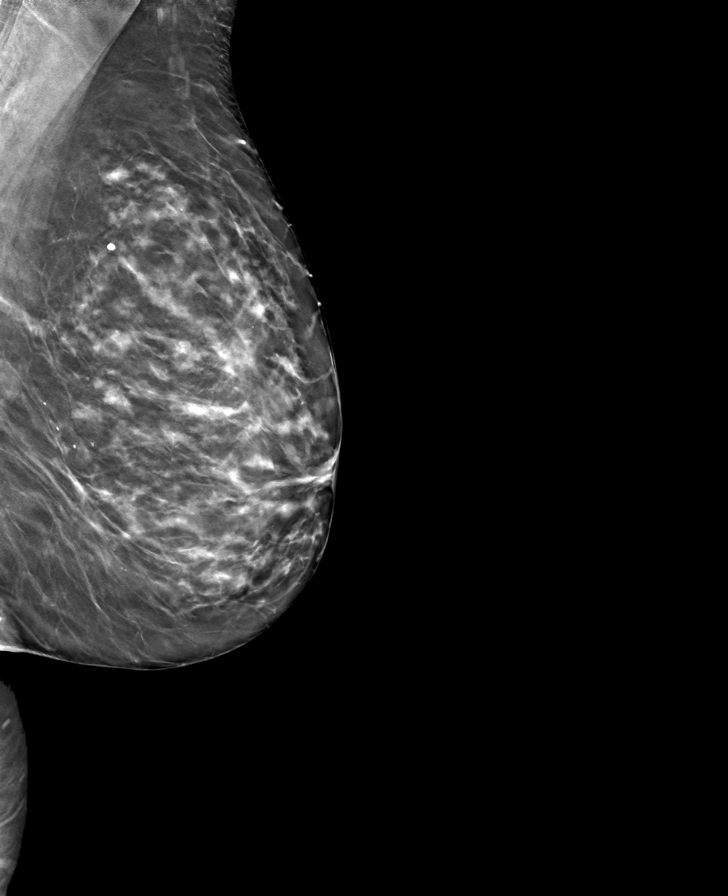

[L CC tomo · tomo slice 35/68.0]
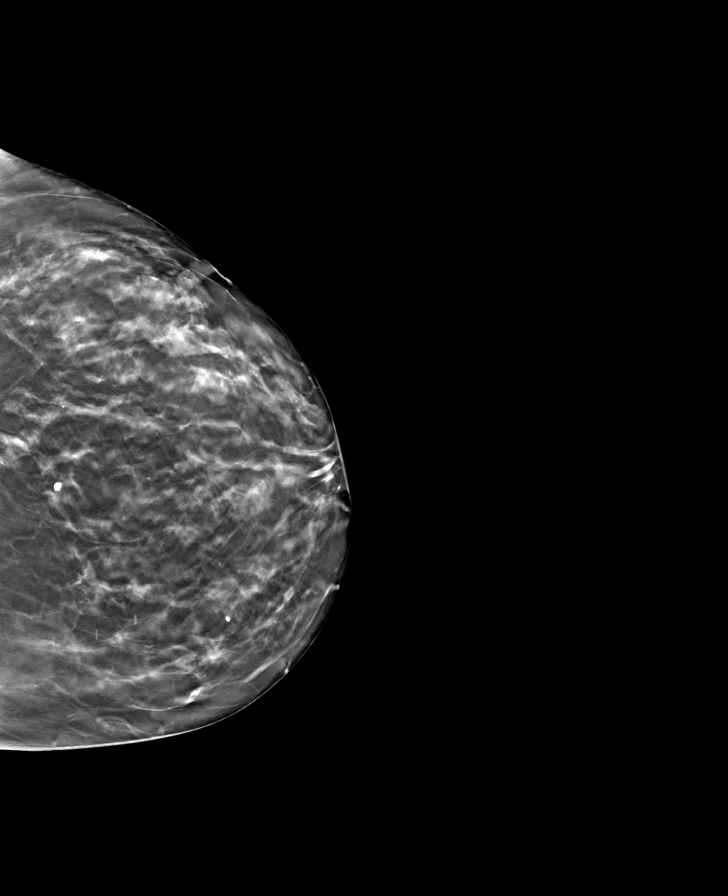

[R CC tomo · tomo slice 33/64.0]
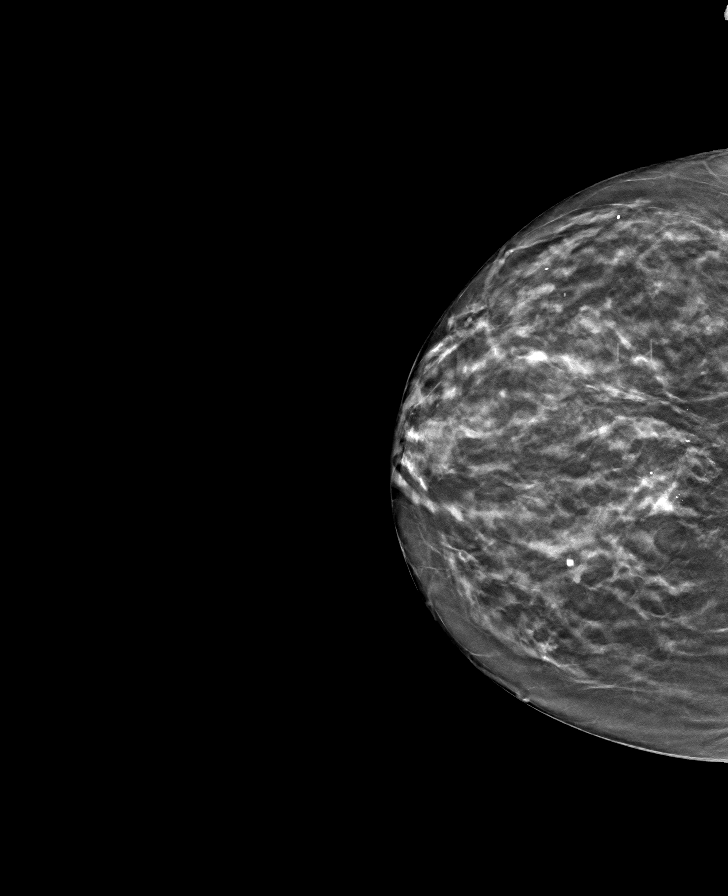

[8 of 24 positions shown; findings below may reference images not displayed]

ACR Breast Density Category c: The breast tissue is heterogeneously
dense, which may obscure small masses.
FINDINGS: In the right breast, a possible asymmetry warrants further
evaluation. In the left breast, no findings suspicious for
malignancy.
IMPRESSION: Further evaluation is suggested for possible asymmetry in the right
breast.

RECOMMENDATION:
Diagnostic mammogram and possibly ultrasound of the right breast.
(Code:06-Y-99A)

The patient will be contacted regarding the findings, and additional
imaging will be scheduled.

BI-RADS CATEGORY  0: Incomplete. Need additional imaging evaluation
and/or prior mammograms for comparison.

## 2022-04-07 ENCOUNTER — Ambulatory Visit (INDEPENDENT_AMBULATORY_CARE_PROVIDER_SITE_OTHER): Payer: Medicare Other | Admitting: "Endocrinology

## 2022-04-07 ENCOUNTER — Encounter: Payer: Self-pay | Admitting: "Endocrinology

## 2022-04-07 VITALS — BP 134/84 | HR 80 | Ht 60.75 in | Wt 144.6 lb

## 2022-04-07 DIAGNOSIS — M810 Age-related osteoporosis without current pathological fracture: Secondary | ICD-10-CM
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
## 2022-04-28 ENCOUNTER — Telehealth: Payer: Self-pay

## 2022-04-28 ENCOUNTER — Encounter: Payer: Self-pay | Admitting: Gynecologic Oncology

## 2022-04-28 ENCOUNTER — Other Ambulatory Visit: Payer: Self-pay

## 2022-04-28 NOTE — Telephone Encounter (Signed)
Ms. Hetzer called stating she needs to make an appointment to see Dr. Pricilla Holm (last visit was 05/2021). She states she has been having vaginal discharge.   Vaginal discharge triage protocol reviewed with patient.   Pt states she noticed the discharge this morning, it was dark brown like old blood when she wiped. No odor, no UTI S&S (pain, pressure, burning, urgency, frequency) no fever/chills, no N/V or diarrhea. No abdominal/pelvic pain at the time but maybe a dull ache now (she thinks that is just nerves) nothing has been inserted in the vagina.  She is concerned because "I know this just isn't right"   First available with Dr. Pricilla Holm is 5/24 however, pt has asked if she could come in sooner, "for a starter" I have scheduled her with Warner Mccreedy NP for tomorrow 4/24 @ 11:00. Pt agreed to date/time.

## 2022-04-29 ENCOUNTER — Inpatient Hospital Stay: Payer: Medicare Other | Attending: Gynecologic Oncology | Admitting: Gynecologic Oncology

## 2022-04-29 VITALS — BP 149/71 | HR 82 | Temp 97.9°F | Resp 18 | Ht 61.0 in | Wt 140.0 lb

## 2022-04-29 DIAGNOSIS — Z90722 Acquired absence of ovaries, bilateral: Secondary | ICD-10-CM | POA: Diagnosis not present

## 2022-04-29 DIAGNOSIS — N898 Other specified noninflammatory disorders of vagina: Secondary | ICD-10-CM | POA: Diagnosis present

## 2022-04-29 DIAGNOSIS — R102 Pelvic and perineal pain: Secondary | ICD-10-CM

## 2022-04-29 DIAGNOSIS — Z8542 Personal history of malignant neoplasm of other parts of uterus: Secondary | ICD-10-CM | POA: Insufficient documentation

## 2022-04-29 DIAGNOSIS — Z90710 Acquired absence of both cervix and uterus: Secondary | ICD-10-CM | POA: Insufficient documentation
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
## 2022-04-29 NOTE — Patient Instructions (Signed)
No concerning findings for cancer return on today's exam.   Please continue to monitor for the drainage and call the office to be seen same day if possible.   Please call the office for persistent cramping and/or any new symptoms.

## 2022-05-01 ENCOUNTER — Telehealth: Payer: Self-pay | Admitting: Surgery

## 2022-05-01 DIAGNOSIS — N939 Abnormal uterine and vaginal bleeding, unspecified: Secondary | ICD-10-CM

## 2022-05-01 DIAGNOSIS — C541 Malignant neoplasm of endometrium: Secondary | ICD-10-CM

## 2022-05-01 DIAGNOSIS — R1030 Lower abdominal pain, unspecified: Secondary | ICD-10-CM

## 2022-05-01 NOTE — Telephone Encounter (Signed)
Called patient to let her know of CT Abd/Pelvis with contrast scheduled for 4/29 at 1pm. Patient needs to arrive at 10:45am to drink oral contrast. Patient verbalized understanding and had no other concerns at this time.

## 2022-05-01 NOTE — Telephone Encounter (Signed)
Called patient to check status of bleeding/cramping. Patient states she has not had anymore episodes of bleeding but is still having cramping and mild headache. States the cramps and headache come and go but are very consistent and affecting her sleep last night. Cramps located in entire lower abdominal area. States she is having daily BM's, no diarrhea or hard stools. Denies urgency or pain with urination, denies blood in urine. Denies fevers/chills, back pain, or appetite changes. Pain triage protocol reviewed with patient.   Patient advised that Dr Pricilla Holm and Efraim Kaufmann would be notified and our office will call her back with their recommendations. Patient verbalized understanding and had no other concerns at this time.

## 2022-05-04 ENCOUNTER — Ambulatory Visit (HOSPITAL_COMMUNITY)
Admission: RE | Admit: 2022-05-04 | Discharge: 2022-05-04 | Disposition: A | Discharge status: Home / Self Care | Payer: Medicare Other | Source: Ambulatory Visit | Attending: Gynecologic Oncology | Admitting: Gynecologic Oncology

## 2022-05-04 DIAGNOSIS — R1030 Lower abdominal pain, unspecified: Secondary | ICD-10-CM

## 2022-05-04 DIAGNOSIS — N939 Abnormal uterine and vaginal bleeding, unspecified: Secondary | ICD-10-CM | POA: Diagnosis present

## 2022-05-04 DIAGNOSIS — C541 Malignant neoplasm of endometrium: Secondary | ICD-10-CM | POA: Diagnosis present

## 2022-05-04 MED ORDER — IOHEXOL 9 MG/ML PO SOLN
ORAL | Status: AC
  Filled 2022-05-04: qty 1000

## 2022-05-04 MED ORDER — IOHEXOL 9 MG/ML PO SOLN
500.0000 mL | ORAL | Status: AC
  Administered 2022-05-04 (×2): 500 mL via ORAL

## 2022-05-04 MED ORDER — SODIUM CHLORIDE (PF) 0.9 % IJ SOLN
INTRAMUSCULAR | Status: AC
  Filled 2022-05-04: qty 50

## 2022-05-04 MED ORDER — IOHEXOL 300 MG/ML  SOLN
75.0000 mL | Freq: Once | INTRAMUSCULAR | Status: AC | PRN
  Administered 2022-05-04: 75 mL via INTRAVENOUS

## 2022-05-05 ENCOUNTER — Telehealth: Payer: Self-pay

## 2022-05-05 NOTE — Telephone Encounter (Signed)
Opened in error

## 2022-05-05 NOTE — Telephone Encounter (Signed)
-----   Message from Doylene Bode, NP sent at 05/04/2022  3:32 PM EDT ----- Please let the patient know her CT scan results have returned.  -There are no findings concerning for cancer return or metastatic disease.  Good news!  -They do see that she has a significant amount of diverticulosis in her colon.  There is no evidence of diverticulitis which is where these outpouchings in the colon can become infected.  This would be something that would need to be followed by her gastroenterologist and/or PCP.    -They note that she has a large hiatal hernia which is where the stomach can come through/herniate through the diaphragm.  Symptoms of this can be reflux.    -They see some simple cysts in the liver that do not look concerning.  They also note arteriosclerosis in the aorta which can be followed by her PCP as well. Can see with age and elevated cholesterol

## 2022-05-05 NOTE — Telephone Encounter (Signed)
See Below message from Warner Mccreedy NP,  Pt is aware of CT results. Pt states she will follow up with her PCP. Verified PCP is Kathlee Nations and he will see results in [facility] system.  Pt was thankful for the call.

## 2022-05-20 ENCOUNTER — Ambulatory Visit: Payer: Medicare Other | Admitting: "Endocrinology

## 2022-07-28 ENCOUNTER — Encounter: Payer: Self-pay | Admitting: Gynecologic Oncology

## 2022-07-31 ENCOUNTER — Encounter: Payer: Self-pay | Admitting: Gynecologic Oncology

## 2022-07-31 ENCOUNTER — Inpatient Hospital Stay: Payer: Medicare Other | Attending: Gynecologic Oncology | Admitting: Gynecologic Oncology

## 2022-07-31 VITALS — BP 132/74 | HR 97 | Temp 97.9°F | Resp 18 | Ht 61.0 in | Wt 143.0 lb

## 2022-07-31 DIAGNOSIS — Z79890 Hormone replacement therapy (postmenopausal): Secondary | ICD-10-CM | POA: Insufficient documentation

## 2022-07-31 DIAGNOSIS — Z90722 Acquired absence of ovaries, bilateral: Secondary | ICD-10-CM | POA: Diagnosis not present

## 2022-07-31 DIAGNOSIS — C541 Malignant neoplasm of endometrium: Secondary | ICD-10-CM

## 2022-07-31 DIAGNOSIS — Z90710 Acquired absence of both cervix and uterus: Secondary | ICD-10-CM | POA: Insufficient documentation

## 2022-07-31 DIAGNOSIS — Z8542 Personal history of malignant neoplasm of other parts of uterus: Secondary | ICD-10-CM | POA: Diagnosis not present

## 2022-07-31 NOTE — Patient Instructions (Signed)
It was good to see you today.  I do not see or feel any evidence of cancer recurrence on your exam.  Please reach out to the OB/GYN office and schedule a visit with them in January.  Please call my office sometime in the spring to schedule visit to see me this time next year.  As always, if you develop any new and concerning symptoms before your next visit, please call to see me sooner.
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
## 2022-10-28 ENCOUNTER — Other Ambulatory Visit: Payer: Self-pay | Admitting: Internal Medicine

## 2022-10-28 DIAGNOSIS — Z1231 Encounter for screening mammogram for malignant neoplasm of breast: Secondary | ICD-10-CM

## 2022-12-11 ENCOUNTER — Ambulatory Visit: Payer: Medicare Other

## 2022-12-21 ENCOUNTER — Ambulatory Visit: Payer: Medicare Other | Admitting: Obstetrics and Gynecology

## 2022-12-24 LAB — MOLECULAR PATHOLOGY

## 2023-01-01 ENCOUNTER — Ambulatory Visit: Payer: Medicare Other

## 2023-01-06 DIAGNOSIS — S7290XA Unspecified fracture of unspecified femur, initial encounter for closed fracture: Secondary | ICD-10-CM

## 2023-01-06 HISTORY — DX: Unspecified fracture of unspecified femur, initial encounter for closed fracture: S72.90XA

## 2023-01-14 ENCOUNTER — Encounter: Payer: Medicare Other | Admitting: Obstetrics and Gynecology

## 2023-01-18 ENCOUNTER — Ambulatory Visit: Payer: Medicare Other

## 2023-02-18 ENCOUNTER — Ambulatory Visit: Payer: Medicare Other

## 2023-02-18 ENCOUNTER — Encounter: Payer: No Typology Code available for payment source | Admitting: Obstetrics and Gynecology

## 2023-03-16 ENCOUNTER — Ambulatory Visit
Admission: RE | Admit: 2023-03-16 | Discharge: 2023-03-16 | Disposition: A | Discharge status: Home / Self Care | Payer: Medicare Other | Source: Ambulatory Visit | Attending: Internal Medicine | Admitting: Internal Medicine

## 2023-03-16 DIAGNOSIS — Z1231 Encounter for screening mammogram for malignant neoplasm of breast: Secondary | ICD-10-CM

## 2023-03-24 ENCOUNTER — Encounter: Payer: Self-pay | Admitting: Obstetrics and Gynecology

## 2023-03-24 ENCOUNTER — Other Ambulatory Visit (HOSPITAL_COMMUNITY)
Admission: RE | Admit: 2023-03-24 | Discharge: 2023-03-24 | Disposition: A | Discharge status: Home / Self Care | Source: Ambulatory Visit | Attending: Obstetrics and Gynecology | Admitting: Obstetrics and Gynecology

## 2023-03-24 ENCOUNTER — Ambulatory Visit (INDEPENDENT_AMBULATORY_CARE_PROVIDER_SITE_OTHER): Payer: No Typology Code available for payment source | Admitting: Obstetrics and Gynecology

## 2023-03-24 VITALS — BP 108/64 | HR 96 | Ht 60.5 in | Wt 138.0 lb

## 2023-03-24 DIAGNOSIS — Z9289 Personal history of other medical treatment: Secondary | ICD-10-CM

## 2023-03-24 DIAGNOSIS — N952 Postmenopausal atrophic vaginitis: Secondary | ICD-10-CM

## 2023-03-24 DIAGNOSIS — Z124 Encounter for screening for malignant neoplasm of cervix: Secondary | ICD-10-CM

## 2023-03-24 DIAGNOSIS — C541 Malignant neoplasm of endometrium: Secondary | ICD-10-CM

## 2023-03-24 DIAGNOSIS — M810 Age-related osteoporosis without current pathological fracture: Secondary | ICD-10-CM | POA: Diagnosis not present

## 2023-03-24 DIAGNOSIS — R87610 Atypical squamous cells of undetermined significance on cytologic smear of cervix (ASC-US): Secondary | ICD-10-CM | POA: Diagnosis not present

## 2023-03-24 DIAGNOSIS — R350 Frequency of micturition: Secondary | ICD-10-CM

## 2023-03-24 DIAGNOSIS — Z9189 Other specified personal risk factors, not elsewhere classified: Secondary | ICD-10-CM

## 2023-03-24 DIAGNOSIS — Z01419 Encounter for gynecological examination (general) (routine) without abnormal findings: Secondary | ICD-10-CM
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
## 2023-03-26 ENCOUNTER — Encounter: Payer: Self-pay | Admitting: Obstetrics and Gynecology

## 2023-03-26 LAB — URINALYSIS, COMPLETE W/RFL CULTURE
Bilirubin Urine: NEGATIVE
Glucose, UA: NEGATIVE
Hgb urine dipstick: NEGATIVE
Hyaline Cast: NONE SEEN /LPF
Leukocyte Esterase: NEGATIVE
Nitrites, Initial: NEGATIVE
RBC / HPF: NONE SEEN /HPF (ref 0–2)
Specific Gravity, Urine: 1.02 (ref 1.001–1.035)
pH: 5.5 (ref 5.0–8.0)

## 2023-03-26 LAB — URINE CULTURE
MICRO NUMBER:: 16220763
Result:: NO GROWTH
SPECIMEN QUALITY:: ADEQUATE

## 2023-03-26 LAB — CULTURE INDICATED

## 2023-03-30 LAB — CYTOLOGY - PAP: Diagnosis: NEGATIVE

## 2023-07-08 ENCOUNTER — Encounter: Payer: Self-pay | Admitting: Gynecologic Oncology

## 2023-07-15 ENCOUNTER — Inpatient Hospital Stay: Attending: Gynecologic Oncology | Admitting: Gynecologic Oncology

## 2023-07-15 ENCOUNTER — Encounter: Payer: Self-pay | Admitting: Gynecologic Oncology

## 2023-07-15 VITALS — BP 124/66 | HR 75 | Temp 97.8°F | Resp 18 | Wt 142.0 lb

## 2023-07-15 DIAGNOSIS — Z8542 Personal history of malignant neoplasm of other parts of uterus: Secondary | ICD-10-CM | POA: Insufficient documentation

## 2023-07-15 DIAGNOSIS — C541 Malignant neoplasm of endometrium: Secondary | ICD-10-CM

## 2023-07-15 DIAGNOSIS — R232 Flushing: Secondary | ICD-10-CM

## 2023-07-15 NOTE — Patient Instructions (Signed)
 It was good to see you today.  I do not see or feel any evidence of cancer recurrence on your exam.  Please call you OBGYN for a visit in 6 months. We will see you for follow-up in 12 months.  As always, if you develop any new and concerning symptoms before your next visit, please call to see me sooner.
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
## 2023-07-16 ENCOUNTER — Ambulatory Visit: Admitting: Gynecologic Oncology

## 2024-03-30 ENCOUNTER — Encounter: Admitting: Obstetrics and Gynecology

## 2024-07-18 ENCOUNTER — Ambulatory Visit: Admitting: Gynecologic Oncology
